# Patient Record
Sex: Female | Born: 1945
Health system: Southern US, Community
[De-identification: ages and names within clinical notes are randomized; demographics above are authoritative.]

## PROBLEM LIST (undated history)

## (undated) DIAGNOSIS — E119 Type 2 diabetes mellitus without complications: Secondary | ICD-10-CM

## (undated) DIAGNOSIS — I2510 Atherosclerotic heart disease of native coronary artery without angina pectoris: Secondary | ICD-10-CM

## (undated) DIAGNOSIS — E1165 Type 2 diabetes mellitus with hyperglycemia: Secondary | ICD-10-CM

## (undated) DIAGNOSIS — E785 Hyperlipidemia, unspecified: Secondary | ICD-10-CM

## (undated) DIAGNOSIS — Z720 Tobacco use: Secondary | ICD-10-CM

## (undated) DIAGNOSIS — I219 Acute myocardial infarction, unspecified: Secondary | ICD-10-CM

## (undated) HISTORY — PX: TUBAL LIGATION: SHX77

## (undated) HISTORY — PX: LAPAROSCOPY WITH TUBAL LIGATION: SHX5576

## (undated) HISTORY — PX: APPENDECTOMY: SHX54

## (undated) HISTORY — PX: CORONARY STENT PLACEMENT: SHX1402

---

## 2012-11-06 ENCOUNTER — Encounter: Payer: Self-pay | Admitting: Cardiology

## 2012-11-06 ENCOUNTER — Encounter (HOSPITAL_COMMUNITY): Payer: Self-pay | Admitting: Physician Assistant

## 2012-11-06 ENCOUNTER — Encounter (HOSPITAL_COMMUNITY)
Admission: EM | Disposition: A | Discharge status: Home / Self Care | Payer: Self-pay | Attending: Cardiovascular Disease

## 2012-11-06 ENCOUNTER — Ambulatory Visit (HOSPITAL_COMMUNITY): Admit: 2012-11-06 | Payer: Self-pay | Admitting: Cardiovascular Disease

## 2012-11-06 ENCOUNTER — Inpatient Hospital Stay (HOSPITAL_COMMUNITY)
Admission: EM | Admit: 2012-11-06 | Discharge: 2012-11-08 | DRG: 247 | Disposition: A | Discharge status: Home / Self Care | Payer: Medicare Other | Attending: Cardiovascular Disease | Admitting: Cardiovascular Disease

## 2012-11-06 DIAGNOSIS — I2109 ST elevation (STEMI) myocardial infarction involving other coronary artery of anterior wall: Principal | ICD-10-CM | POA: Diagnosis present

## 2012-11-06 DIAGNOSIS — Z955 Presence of coronary angioplasty implant and graft: Secondary | ICD-10-CM

## 2012-11-06 DIAGNOSIS — Z720 Tobacco use: Secondary | ICD-10-CM | POA: Diagnosis present

## 2012-11-06 DIAGNOSIS — I2510 Atherosclerotic heart disease of native coronary artery without angina pectoris: Secondary | ICD-10-CM | POA: Diagnosis present

## 2012-11-06 DIAGNOSIS — E119 Type 2 diabetes mellitus without complications: Secondary | ICD-10-CM | POA: Diagnosis present

## 2012-11-06 DIAGNOSIS — F17200 Nicotine dependence, unspecified, uncomplicated: Secondary | ICD-10-CM | POA: Diagnosis present

## 2012-11-06 DIAGNOSIS — I10 Essential (primary) hypertension: Secondary | ICD-10-CM | POA: Diagnosis present

## 2012-11-06 DIAGNOSIS — IMO0002 Reserved for concepts with insufficient information to code with codable children: Secondary | ICD-10-CM

## 2012-11-06 DIAGNOSIS — E1165 Type 2 diabetes mellitus with hyperglycemia: Secondary | ICD-10-CM

## 2012-11-06 DIAGNOSIS — E785 Hyperlipidemia, unspecified: Secondary | ICD-10-CM | POA: Diagnosis present

## 2012-11-06 HISTORY — DX: Acute myocardial infarction, unspecified: I21.9

## 2012-11-06 HISTORY — DX: Type 2 diabetes mellitus with hyperglycemia: E11.65

## 2012-11-06 HISTORY — DX: Atherosclerotic heart disease of native coronary artery without angina pectoris: I25.10

## 2012-11-06 HISTORY — DX: Tobacco use: Z72.0

## 2012-11-06 HISTORY — DX: Hyperlipidemia, unspecified: E78.5

## 2012-11-06 HISTORY — PX: LEFT HEART CATHETERIZATION WITH CORONARY ANGIOGRAM: SHX5451

## 2012-11-06 LAB — COMPREHENSIVE METABOLIC PANEL WITH GFR
ALT: 20 U/L (ref 0–35)
AST: 60 U/L — ABNORMAL HIGH (ref 0–37)
Albumin: 3.6 g/dL (ref 3.5–5.2)
Alkaline Phosphatase: 107 U/L (ref 39–117)
BUN: 8 mg/dL (ref 6–23)
CO2: 24 meq/L (ref 19–32)
Calcium: 8.2 mg/dL — ABNORMAL LOW (ref 8.4–10.5)
Chloride: 96 meq/L (ref 96–112)
Creatinine, Ser: 0.59 mg/dL (ref 0.50–1.10)
GFR calc Af Amer: >90 mL/min
GFR calc non Af Amer: >90 mL/min
Glucose, Bld: 119 mg/dL — ABNORMAL HIGH (ref 70–99)
Potassium: 3.9 meq/L (ref 3.5–5.1)
Sodium: 133 meq/L — ABNORMAL LOW (ref 135–145)
Total Bilirubin: 0.4 mg/dL (ref 0.3–1.2)
Total Protein: 6.9 g/dL (ref 6.0–8.3)

## 2012-11-06 LAB — TROPONIN I: Troponin I: 8.18 ng/mL (ref <0.30)

## 2012-11-06 LAB — MRSA PCR SCREENING: MRSA by PCR: NEGATIVE

## 2012-11-06 LAB — PRO B NATRIURETIC PEPTIDE: Pro B Natriuretic peptide (BNP): 2216 pg/mL — ABNORMAL HIGH (ref 0–125)

## 2012-11-06 SURGERY — LEFT HEART CATHETERIZATION WITH CORONARY ANGIOGRAM
Anesthesia: LOCAL

## 2012-11-06 MED ORDER — SODIUM CHLORIDE 0.9 % IJ SOLN
3.0000 mL | Freq: Two times a day (BID) | INTRAMUSCULAR | Status: DC
  Administered 2012-11-07 – 2012-11-08 (×3): 3 mL via INTRAVENOUS

## 2012-11-06 MED ORDER — NITROGLYCERIN IN D5W 200-5 MCG/ML-% IV SOLN
3.0000 ug/min | INTRAVENOUS | Status: DC
  Administered 2012-11-06: 10 ug/min via INTRAVENOUS

## 2012-11-06 MED ORDER — INFLUENZA VAC SPLIT QUAD 0.5 ML IM SUSP
0.5000 mL | INTRAMUSCULAR | Status: AC
  Administered 2012-11-07: 0.5 mL via INTRAMUSCULAR
  Filled 2012-11-06: qty 0.5

## 2012-11-06 MED ORDER — METOPROLOL TARTRATE 25 MG PO TABS
25.0000 mg | ORAL_TABLET | Freq: Two times a day (BID) | ORAL | Status: DC
  Administered 2012-11-06 – 2012-11-08 (×4): 25 mg via ORAL
  Filled 2012-11-06 (×5): qty 1

## 2012-11-06 MED ORDER — MORPHINE SULFATE 2 MG/ML IJ SOLN
2.0000 mg | INTRAMUSCULAR | Status: DC | PRN
  Administered 2012-11-06: 2 mg via INTRAVENOUS
  Filled 2012-11-06: qty 1

## 2012-11-06 MED ORDER — ATORVASTATIN CALCIUM 80 MG PO TABS
80.0000 mg | ORAL_TABLET | Freq: Every day | ORAL | Status: DC
  Administered 2012-11-06 – 2012-11-07 (×2): 80 mg via ORAL
  Filled 2012-11-06 (×3): qty 1

## 2012-11-06 MED ORDER — PNEUMOCOCCAL VAC POLYVALENT 25 MCG/0.5ML IJ INJ
0.5000 mL | INJECTION | INTRAMUSCULAR | Status: AC
  Administered 2012-11-07: 0.5 mL via INTRAMUSCULAR
  Filled 2012-11-06: qty 0.5

## 2012-11-06 MED ORDER — TICAGRELOR 90 MG PO TABS
90.0000 mg | ORAL_TABLET | Freq: Two times a day (BID) | ORAL | Status: DC
  Administered 2012-11-06 – 2012-11-08 (×4): 90 mg via ORAL
  Filled 2012-11-06 (×5): qty 1

## 2012-11-06 MED ORDER — NITROGLYCERIN 0.4 MG SL SUBL: 0.4000 mg | SUBLINGUAL_TABLET | SUBLINGUAL | Status: DC | PRN

## 2012-11-06 MED ORDER — PANTOPRAZOLE SODIUM 20 MG PO TBEC
20.0000 mg | DELAYED_RELEASE_TABLET | Freq: Every day | ORAL | Status: DC
  Administered 2012-11-06 – 2012-11-08 (×3): 20 mg via ORAL
  Filled 2012-11-06 (×3): qty 1

## 2012-11-06 MED ORDER — LORAZEPAM 0.5 MG PO TABS
0.5000 mg | ORAL_TABLET | ORAL | Status: DC | PRN
  Administered 2012-11-06: 0.5 mg via ORAL
  Filled 2012-11-06: qty 1

## 2012-11-06 MED ORDER — SODIUM CHLORIDE 0.9 % IJ SOLN
3.0000 mL | INTRAMUSCULAR | Status: DC | PRN
  Administered 2012-11-06: 3 mL via INTRAVENOUS

## 2012-11-06 MED ORDER — SODIUM CHLORIDE 0.9 % IV SOLN: 250.0000 mL | INTRAVENOUS | Status: DC | PRN

## 2012-11-06 MED ORDER — ONDANSETRON HCL 4 MG/2ML IJ SOLN: 4.0000 mg | Freq: Four times a day (QID) | INTRAMUSCULAR | Status: DC | PRN

## 2012-11-06 MED ORDER — GI COCKTAIL ~~LOC~~
30.0000 mL | Freq: Three times a day (TID) | ORAL | Status: DC | PRN
  Administered 2012-11-06: 30 mL via ORAL
  Filled 2012-11-06: qty 30

## 2012-11-06 MED ORDER — ACETAMINOPHEN 325 MG PO TABS: 650.0000 mg | ORAL_TABLET | ORAL | Status: DC | PRN

## 2012-11-06 MED ORDER — SODIUM CHLORIDE 0.9 % IV SOLN
1.0000 mL/kg/h | INTRAVENOUS | Status: AC
  Administered 2012-11-06: 1 mL/kg/h via INTRAVENOUS

## 2012-11-06 MED ORDER — ENALAPRIL MALEATE 2.5 MG PO TABS
2.5000 mg | ORAL_TABLET | Freq: Two times a day (BID) | ORAL | Status: DC
  Administered 2012-11-06: 2.5 mg via ORAL
  Filled 2012-11-06 (×3): qty 1

## 2012-11-06 MED ORDER — OXYCODONE-ACETAMINOPHEN 5-325 MG PO TABS
1.0000 | ORAL_TABLET | ORAL | Status: DC | PRN
  Administered 2012-11-07 – 2012-11-08 (×2): 1 via ORAL
  Administered 2012-11-08 (×2): 2 via ORAL
  Filled 2012-11-06 (×2): qty 2
  Filled 2012-11-06 (×2): qty 1

## 2012-11-06 NOTE — Progress Notes (Signed)
Rhonda Barrett PA notified of + troponin.

## 2012-11-06 NOTE — H&P (Signed)
History and Physical   Patient ID: Marissa Schneider MRN: [id number], DOB/AGE: 02/20/1945 67 y.o. Date of Encounter: 11/06/2012  Primary Physician: No PCP Per Patient Primary Cardiologist: New, will followup in [place]  Chief Complaint:  STEMI  HPI: Marissa Schneider is a 67 y.o. female with no history of CAD. She had chest pain intermittently over the last week. She went to a club last night and went out for breakfast afterwards at about 3 AM. At 7 AM, she had onset of substernal chest pain she describes as a "bubble" and a pressure. It reached a 10/10. The pain went through to her back and was associated with some shortness of breath, abdominal pain, nausea and vomiting (hematemesis) with diaphoresis. She went to [facility] where her ECG was consistent with an anterior ST elevation MI. She was treated there and transferred emergently to [facility]. Upon arrival at time, she was still having pain and her ECG was still abnormal. She was taken directly to the Cath Lab   Past medical history: The patient has not had a recent checkup. She is not aware of any current diagnosis of hypertension, diabetes or hyperlipidemia since she lost weight (more than 50 pounds).  Surgical History:  Past Surgical History  Procedure Laterality Date  . Laparoscopy with tubal ligation       I have reviewed the patient's current medications. Prior to Admission medications   Ibuprofen and Benadryl when necessary     Allergies:  Allergies  Allergen Reactions  . Aspirin Rash    History   Social History  . Marital Status: Single    Spouse Name: N/A    Number of Children: N/A  . Years of Education: N/A   Occupational History  . Security guard    Social History Main Topics  . Smoking status: Current Every Day Smoker -- 1.00 packs/day for 50 years  . Smokeless tobacco: Never Used  . Alcohol Use: Yes     Comment: Drinks at social events  . Drug Use: No  . Sexual Activity: Not on  file   Other Topics Concern  . Not on file   Social History Narrative   Lives alone    No family history on file. Family Status  Relation Status Death Age  . Mother Deceased     No history of CAD  . Father Deceased     No history of CAD  . Brother Deceased 58    MI  . Maternal Grandmother Deceased     History of CAD/MI    Review of Systems:   Full 14-point review of systems otherwise negative except as noted above.  Physical Exam: Height 5\' 8"  (1.727 m), weight 160 lb (72.576 kg). General: Well developed, well nourished,female in moderate acute distress. Head: Normocephalic, atraumatic, sclera non-icteric, no xanthomas, nares are without discharge. Dentition: Good Neck: No carotid bruits. JVD not elevated. No thyromegally Lungs: Good expansion bilaterally. without wheezes or rhonchi.  Heart: Regular rate and rhythm with S1 S2.  No S3 or S4.  No murmur, no rubs, or gallops appreciated. Abdomen: Soft, non-tender, non-distended with normoactive bowel sounds. No hepatomegaly. No rebound/guarding. No obvious abdominal masses. Msk:  Strength and tone appear normal for age. No joint deformities or effusions, no spine or costo-vertebral angle tenderness. Extremities: No clubbing or cyanosis. No edema.  Distal pedal pulses are 2+ in 4 extrem Neuro: Alert and oriented X 3. Moves all extremities spontaneously. No focal deficits noted. Psych:  Responds to questions appropriately with a normal affect. Skin: No rashes or lesions noted  Labs: Done at [facility]   WBC 5.6   HGB 14.8   HCT 44.5   MCV 85.9   PLT 234       NA 136   K 3.5   CL 99   CO2 29   BUN 10   CR 0.79   GLU 175       LFTs WNL       CK 302   CKMB 13.8   INDEX 4.5   TROP 0.95       INR 1.0   PT 12.9   PTT 28.7         Radiology/Studies: At [facility]    ECG: Sinus rhythm, anterior ST elevation  ASSESSMENT AND PLAN:  Active Problems:   * No active hospital problems. *   Signed, Theodore Demark,  PA-C 11/06/2012 1:48 PM Beeper [phone]  Patient seen, examined. Available data reviewed. Agree with findings, assessment, and plan as outlined by Theodore Demark, PA-C. The patient is a 67 year-old woman with no history of HTN/DM/dysplipidemia presenting with an acute anterior STEMI. She has had intermittent CP x 24-36 hours but constant pain x 6 hours radiating to her back. EKG is diagnostic of STEMI. She presents as a transfer from [facility] directly to the cath lab. Exam as outlined above with which I agree. Pulses are 2+ peripherally, there is no edema, heart is RRR, and lungs are clear. No abdominal tenderness.   ASSESSMENT: 1. Anterior STEMI - emergency cath and PCI. ASA allergy received plavix 300 mg in [place] will load with Brilinta 180 mg since she will be treated with monotherapy. Further plans pending cath results.  2. Tobacco - cessation advised and will receive counseling in the hospital.   Further plans pending cath result. Emergency implied consent obtained. Pt is alert and oriented and understands plan.  Tonny Bollman, M.D. 11/06/2012 2:27 PM

## 2012-11-06 NOTE — CV Procedure (Signed)
    Cardiac Catheterization Procedure Note  Name: Marissa Schneider MRN: [id number] DOB: [birth year]  Procedure: Left Heart Cath, Selective Coronary Angiography, LV angiography, PTCA and stenting of the mid-LAD  Indication: Anterior STEMI  Procedural Details:  The right wrist was prepped, draped, and anesthetized with 1% lidocaine. Using the modified Seldinger technique, a 5/6 French slender sheath was introduced into the right radial artery. 3 mg of verapamil was administered through the sheath, weight-based unfractionated heparin was administered intravenously. Standard Judkins catheters were used for selective coronary angiography and left ventriculography. Catheter exchanges were performed over an exchange length guidewire.  PROCEDURAL FINDINGS Hemodynamics: AO 175/88 LV 177/16   Coronary angiography: Coronary dominance: right  Left mainstem: Patent vessel with 20-30% distal LM stenosis  Left anterior descending (LAD): Proximal irregularity. There is severe disease in the mid-LAD. Diffuse 50% stenosis leading into a 99% mid-vessel stenosis. The first diagonal is large with 50% ostial stenosis and it originates at the proximal aspect of LAD plaque. The distal LAD is tortuous and widely patent as it wraps the LV apex.   Left circumflex (LCx): 50% ostial stenosis, 50% stenosis of OM1 with sequential lesions  Right coronary artery (RCA): normal caliber, dominant vessel. There is diffuse disease noted. There is 50% mid-vessel stenosis and 50-70% bifurcational disease at the PDA/PLA origin.  Left ventriculography: Left ventricular systolic function is moderately depressed. There is hypokinesis of the distal anterior wall and anteroapex. The LVEF is estimated at 45%.   PCI Note:  Following the diagnostic procedure, the decision was made to proceed with PCI. She was loaded with brilinta 180 mg. Weight-based bivalirudin was given for anticoagulation. Once a therapeutic ACT was achieved, a  6 [place] XB-LAD guide catheter was inserted.  A cougar coronary guidewire was initially used but would not cross the lesion so a BMW wire was used to cross the lesion.  The lesion was predilated with a 2.0x15 mm balloon.  The lesion was then stented with a 2.5x24 mm Promus Premier drug-eluting stent.  The stent was postdilated with a 2.75 mm noncompliant balloon to 16 atm.  Following PCI, there was 0% residual stenosis and TIMI-3 flow. Final angiography confirmed an excellent result. The patient tolerated the procedure well. There were no immediate procedural complications. A TR band was used for radial hemostasis. The patient was transferred to the post catheterization recovery area for further monitoring.  PCI Data: Vessel - LAD/Segment - mid Percent Stenosis (pre)  99 TIMI-flow 2 Stent 2.5x24 mm Promus Premier DES Percent Stenosis (post) 0 TIMI-flow (post) 3  Final Conclusions:   1. Severe mid-LAD stenosis treated successfully with primary PCI using a DES 2. Moderate RCA and LCx stenosis 3. Moderate LV dysfunction   Recommendations:  Tobacco cessation, post-MI medical therapy. Brilinta alone - long-term since ASA allergy. If no complication anticipate discharge Tuesday 10/14.  Tonny Bollman 11/06/2012, 2:38 PM

## 2012-11-06 NOTE — Progress Notes (Signed)
2000:  Dr. Jon Billings notified of increased BP.  Orders rec'd. 2015: "I've got this chest pressure".  Patient up to bedside commode first time since cardiac intervention.  C/O substernal pressure 8/10 with radiation down right arm. Back to bed with assistance. Placed on O2@2L /M.  Stat EKG done.  Titrating NTG drip to chest pain without relief of symptoms.  Patient denies nausea, SOB.  Morphine 2 mg IV PRN Chest Pain.  Dr. Jon Billings paged, notified of all parameters. 2025:  Dr. Jon Billings at bedside.  Patient now complaining of pressure in chest and epigastric area, tenderness in R & L Upper quadrants of abdomen.  Dr. Jon Billings notified of all parameters.  Orders received. 2045:  Chest pain decreasing in intensity.  Continue to monitor. 2130:  Patient sleeping intermittently.  States pain is 1/10.  Continue to monitor.

## 2012-11-07 ENCOUNTER — Encounter (HOSPITAL_COMMUNITY): Payer: Self-pay | Admitting: Nurse Practitioner

## 2012-11-07 DIAGNOSIS — Z720 Tobacco use: Secondary | ICD-10-CM | POA: Diagnosis present

## 2012-11-07 DIAGNOSIS — E785 Hyperlipidemia, unspecified: Secondary | ICD-10-CM

## 2012-11-07 DIAGNOSIS — F17200 Nicotine dependence, unspecified, uncomplicated: Secondary | ICD-10-CM

## 2012-11-07 DIAGNOSIS — I2510 Atherosclerotic heart disease of native coronary artery without angina pectoris: Secondary | ICD-10-CM

## 2012-11-07 DIAGNOSIS — I369 Nonrheumatic tricuspid valve disorder, unspecified: Secondary | ICD-10-CM

## 2012-11-07 DIAGNOSIS — I2109 ST elevation (STEMI) myocardial infarction involving other coronary artery of anterior wall: Secondary | ICD-10-CM

## 2012-11-07 LAB — BASIC METABOLIC PANEL
BUN: 9 mg/dL (ref 6–23)
CO2: 25 mEq/L (ref 19–32)
Calcium: 8.2 mg/dL — ABNORMAL LOW (ref 8.4–10.5)
Creatinine, Ser: 0.67 mg/dL (ref 0.50–1.10)
GFR calc Af Amer: >90 mL/min (ref >90)

## 2012-11-07 LAB — POCT I-STAT 3, ART BLOOD GAS (G3+)
O2 Saturation: 98 %
TCO2: 29 mmol/L (ref 0–100)
pCO2 arterial: 51.5 mmHg — ABNORMAL HIGH (ref 35.0–45.0)
pH, Arterial: 7.329 — ABNORMAL LOW (ref 7.350–7.450)
pO2, Arterial: 109 mmHg — ABNORMAL HIGH (ref 80.0–100.0)

## 2012-11-07 LAB — CBC
HCT: 37.7 % (ref 36.0–46.0)
MCH: 28.5 pg (ref 26.0–34.0)
MCHC: 33.7 g/dL (ref 30.0–36.0)
MCV: 84.7 fL (ref 78.0–100.0)
Platelets: 209 10*3/uL (ref 150–400)
RDW: 14.3 % (ref 11.5–15.5)

## 2012-11-07 LAB — POCT I-STAT, CHEM 8
BUN: 7 mg/dL (ref 6–23)
Chloride: 96 mEq/L (ref 96–112)
Creatinine, Ser: 0.7 mg/dL (ref 0.50–1.10)
Sodium: 136 mEq/L (ref 135–145)
TCO2: 25 mmol/L (ref 0–100)

## 2012-11-07 LAB — TROPONIN I
Troponin I: >20 ng/mL (ref <0.30)
Troponin I: >20 ng/mL (ref <0.30)

## 2012-11-07 LAB — LIPID PANEL
Cholesterol: 206 mg/dL — ABNORMAL HIGH (ref 0–200)
VLDL: 41 mg/dL — ABNORMAL HIGH (ref 0–40)

## 2012-11-07 LAB — HEMOGLOBIN A1C: Hgb A1c MFr Bld: 7.3 % — ABNORMAL HIGH (ref <5.7)

## 2012-11-07 MED ORDER — NICOTINE 7 MG/24HR TD PT24
7.0000 mg | MEDICATED_PATCH | Freq: Every day | TRANSDERMAL | Status: DC
  Administered 2012-11-07 – 2012-11-08 (×2): 7 mg via TRANSDERMAL
  Filled 2012-11-07 (×3): qty 1

## 2012-11-07 MED ORDER — INSULIN ASPART 100 UNIT/ML ~~LOC~~ SOLN
0.0000 [IU] | Freq: Three times a day (TID) | SUBCUTANEOUS | Status: DC
  Administered 2012-11-08: 2 [IU] via SUBCUTANEOUS

## 2012-11-07 MED ORDER — ENALAPRIL MALEATE 5 MG PO TABS
5.0000 mg | ORAL_TABLET | Freq: Two times a day (BID) | ORAL | Status: DC
  Administered 2012-11-07 – 2012-11-08 (×3): 5 mg via ORAL
  Filled 2012-11-07 (×4): qty 1

## 2012-11-07 MED FILL — Nitroglycerin IV Soln 200 MCG/ML in D5W: INTRAVENOUS | Qty: 250 | Status: AC

## 2012-11-07 MED FILL — Heparin Sodium (Porcine) 100 Unt/ML in Sodium Chloride 0.45%: INTRAMUSCULAR | Qty: 250 | Status: AC

## 2012-11-07 NOTE — Care Management Note (Addendum)
    Page 1 of 1   11/08/2012     11:01:59 AM   CARE MANAGEMENT NOTE 11/08/2012  Patient:  Marissa Schneider, Marissa Schneider   Account Number:  [id number]  Date Initiated:  11/07/2012  Documentation initiated by:  Junius Creamer  Subjective/Objective Assessment:   adm w mi     Action/Plan:   lives w fam,   Anticipated DC Date:  11/08/2012   Anticipated DC Plan:  HOME/SELF CARE      DC Planning Services  CM consult  Medication Assistance      Choice offered to / List presented to:             Status of service:   Medicare Important Message given?   (If response is "NO", the following Medicare IM given date fields will be blank) Date Medicare IM given:   Date Additional Medicare IM given:    Discharge Disposition:  HOME/SELF CARE  Per UR Regulation:  Reviewed for med. necessity/level of care/duration of stay  If discussed at Long Length of Stay Meetings, dates discussed:    Comments:  10/14  1056a debbie Tashon Capp rn,bsn spoke w pt. she does not have medicare d but is in process of signing up. she has 30day free brilinta card and md has signed his part of pt assist form and pt will fill out rest and send in w income information.  10/13 1027a debbie Wynema Garoutte rn,bsn card rehab gave pt 30day free and copay assist card for brilinta.

## 2012-11-07 NOTE — Progress Notes (Signed)
Echocardiogram 2D Echocardiogram has been performed.  Marissa Schneider 11/07/2012, 9:47 AM

## 2012-11-07 NOTE — Progress Notes (Signed)
Inpatient Diabetes Program Recommendations  AACE/ADA: New Consensus Statement on Inpatient Glycemic Control (2013)  Target Ranges:  Prepandial:   less than 140 mg/dL      Peak postprandial:   less than 180 mg/dL (1-2 hours)      Critically ill patients:  140 - 180 mg/dL   Reason for Assessment: Elevated Hgb A1C with no history of diabetes noted by MD  Inpatient Diabetes Program Recommendations HgbA1C: Hgb A1C is 7.3 indicative of diabetes  Note:  Request MD address elevated Hgb A1C and likely diagnosis of diabetes.  Request Glycemic Control Order Set be completed.  The American Diabetes Association recommends that patients with diabetes have CBG's checked at least four times a day while in the hospital setting.  Please order CBG's ac & hs while eating or if patient becomes NPO, every 4 hours. Request CHO Modified diet order.  Request MD discuss Hgb A1C with patient and its implications.  After MD has discussed Hgb A1C, will place dietitian consult, etc.  Discussed with Aldean Jewett, RN.  Thank you.  Marissa Schneider S. Marissa Lincoln, RN, CNS, CDE Inpatient Diabetes Program, team pager [phone]

## 2012-11-07 NOTE — Progress Notes (Signed)
Chaplain received page for code stemi and met pt's family in short stay waiting area by the cath lab. Chaplain acted as Print production planner between family and Programme researcher, broadcasting/film/video, and was present with family when PA and physician came to speak with the family.   Chaplain escorted family to [facility] waiting area and informed nursing that they were waiting to be called back to be with pt.   Chaplain provided emotional/spiritual support, presence, empathic listening, and acted as Print production planner with family and staff.  Family was very appreciative of chaplain's presence and care.  Will refer for follow up.  Maurene Capes, Chaplain

## 2012-11-07 NOTE — Plan of Care (Signed)
Problem: Consults Goal: Tobacco Cessation referral if indicated Outcome: Progressing Ordered Nicotine patch.

## 2012-11-07 NOTE — Progress Notes (Signed)
Patient Name: Larinda Herter Date of Encounter: 11/07/2012   Principal Problem:   ST elevation myocardial infarction (STEMI) of anterior wall Active Problems:   CAD (coronary artery disease)   Tobacco abuse   Hyperlipidemia    SUBJECTIVE  The patient denis any chest pain or shortness of breath.  CURRENT MEDS . atorvastatin  80 mg Oral q1800  . enalapril  2.5 mg Oral BID  . influenza vac split quadrivalent PF  0.5 mL Intramuscular Tomorrow-1000  . metoprolol tartrate  25 mg Oral BID  . pantoprazole  20 mg Oral Daily  . pneumococcal 23 valent vaccine  0.5 mL Intramuscular Tomorrow-1000  . sodium chloride  3 mL Intravenous Q12H  . Ticagrelor  90 mg Oral BID    OBJECTIVE  Filed Vitals:   11/07/12 0417 11/07/12 0500 11/07/12 0600 11/07/12 0700  BP:  126/60 137/56 119/57  Pulse:  74 76 71  Temp:      TempSrc:      Resp:  14 17 20   Height:      Weight: 165 lb 9.1 oz (75.1 kg)     SpO2:  98% 100% 98%    Intake/Output Summary (Last 24 hours) at 11/07/12 0806 Last data filed at 11/07/12 0715  Gross per 24 hour  Intake 1991.48 ml  Output   1650 ml  Net 341.48 ml   Filed Weights   11/06/12 1333 11/06/12 1500 11/07/12 0417  Weight: 160 lb (72.576 kg) 160 lb 0.9 oz (72.6 kg) 165 lb 9.1 oz (75.1 kg)    PHYSICAL EXAM  General: Pleasant, NAD. Neuro: Alert and oriented X 3. Moves all extremities spontaneously. Psych: Normal affect. HEENT:  Normal  Neck: Supple without bruits or JVD. Lungs:  Resp regular and unlabored, CTA. Heart: RRR no s3, s4, or murmurs. Abdomen: Soft, non-tender, non-distended, BS + x 4.  Extremities: No clubbing, cyanosis or edema. DP/PT/Radials 2+ and equal bilaterally.  Accessory Clinical Findings  CBC  Recent Labs  11/07/12 0225  WBC 6.7  HGB 12.7  HCT 37.7  MCV 84.7  PLT 209   Basic Metabolic Panel  Recent Labs  11/06/12 1542 11/07/12 0225  NA 133* 135  K 3.9 3.6  CL 96 100  CO2 24 25  GLUCOSE 119* 129*  BUN 8 9    CREATININE 0.59 0.67  CALCIUM 8.2* 8.2*   Liver Function Tests  Recent Labs  11/06/12 1542  AST 60*  ALT 20  ALKPHOS 107  BILITOT 0.4  PROT 6.9  ALBUMIN 3.6   No results found for this basename: LIPASE, AMYLASE,  in the last 72 hours Cardiac Enzymes  Recent Labs  11/06/12 1542 11/06/12 2038 11/07/12 0225  TROPONINI 8.18* >20.00* >20.00*   Hemoglobin A1C  Recent Labs  11/06/12 1542  HGBA1C 7.3*   Fasting Lipid Panel  Recent Labs  11/07/12 0225  CHOL 206*  HDL 33*  LDLCALC 132*  TRIG 204*  CHOLHDL 6.2   Thyroid Function Tests  Recent Labs  11/06/12 1542  TSH 0.664    TELE  SR, HR 70'  ECG  SR, persistent anterior ST elavations with reciprocal T wave inversions in the inferolateral leads   ASSESSMENT AND PLAN  67 year old female with h/o heavy smoking who was admitted with anterior STEMI yesterday  1. STEMI  - Severe mid-LAD stenosis treated successfully with primary PCI using a DES, residual Moderate RCA and LCx stenosis, Moderate LV dysfunction. Chest pain resolved post PCI. ECG unchanged, we will  follow  - on Ticagrelor 90 mg BID (allergy to aspirin) - high dose Lipitor - Metoprolol 25 mg BID - uptitrate Lisinopril as tolerated by BP  2. Hypertension - controlled on current regimen, uptitrate Enalapril for LV dysfunction  3. Smoking cessation counseling provided  Signed, Lars Masson MD

## 2012-11-07 NOTE — Progress Notes (Signed)
CARDIAC REHAB PHASE I   PRE:  Rate/Rhythm: 77 SR  BP:  Supine:   Sitting: 103/54  Standing:    SaO2:   MODE:  Ambulation: 170 ft   POST:  Rate/Rhythm: 84 SR  BP:  Supine:   Sitting: 124/51  Standing:     SaO2:  0950-1105 Assisted X 1 to ambulate. Gait a little wobbly. Pt c/o of legs feeling weak. VS stable Pt back to recliner after walk with call light in reach. Stated MI and stent education with pt. Discussed smoking cessation with pt. She states that she does not smoke much and feels that stopping will not be a problem for her. Gave pt tips for quitting and coaching contact number. Discussed with pt results of A1C, states that she has had problems with her blood sugar in the past but lost weight and took medication for awhile her blood sugar came back to normal. Pt admits to no regular follow up with physician.She also admits to eating an lot of sweets and drinking beer, heavy at times.  We will follow pt in the am to complete education and continue ambulation.  Melina Copa RN 11/07/2012 11:06 AM

## 2012-11-08 ENCOUNTER — Telehealth: Payer: Self-pay | Admitting: Cardiology

## 2012-11-08 ENCOUNTER — Encounter (HOSPITAL_COMMUNITY): Payer: Self-pay | Admitting: Nurse Practitioner

## 2012-11-08 DIAGNOSIS — IMO0002 Reserved for concepts with insufficient information to code with codable children: Secondary | ICD-10-CM

## 2012-11-08 DIAGNOSIS — E1165 Type 2 diabetes mellitus with hyperglycemia: Secondary | ICD-10-CM

## 2012-11-08 HISTORY — DX: Type 2 diabetes mellitus with hyperglycemia: E11.65

## 2012-11-08 HISTORY — DX: Reserved for concepts with insufficient information to code with codable children: IMO0002

## 2012-11-08 LAB — GLUCOSE, CAPILLARY
Glucose-Capillary: 113 mg/dL — ABNORMAL HIGH (ref 70–99)
Glucose-Capillary: 137 mg/dL — ABNORMAL HIGH (ref 70–99)

## 2012-11-08 MED ORDER — TICAGRELOR 90 MG PO TABS: 90.0000 mg | ORAL_TABLET | Freq: Two times a day (BID) | ORAL | Status: DC

## 2012-11-08 MED ORDER — NITROGLYCERIN 0.4 MG SL SUBL: 0.4000 mg | SUBLINGUAL_TABLET | SUBLINGUAL | Status: DC | PRN

## 2012-11-08 MED ORDER — PANTOPRAZOLE SODIUM 40 MG PO TBEC: 40.0000 mg | DELAYED_RELEASE_TABLET | Freq: Every day | ORAL | Status: DC

## 2012-11-08 MED ORDER — METOPROLOL TARTRATE 25 MG PO TABS: 25.0000 mg | ORAL_TABLET | Freq: Two times a day (BID) | ORAL | Status: DC

## 2012-11-08 MED ORDER — ENALAPRIL MALEATE 5 MG PO TABS: 5.0000 mg | ORAL_TABLET | Freq: Two times a day (BID) | ORAL | Status: DC

## 2012-11-08 MED ORDER — METFORMIN HCL 500 MG PO TABS: 500.0000 mg | ORAL_TABLET | Freq: Every day | ORAL | Status: DC

## 2012-11-08 MED ORDER — ATORVASTATIN CALCIUM 80 MG PO TABS: 80.0000 mg | ORAL_TABLET | Freq: Every day | ORAL | Status: DC

## 2012-11-08 MED ORDER — LIVING WELL WITH DIABETES BOOK
Freq: Once | Status: AC
  Administered 2012-11-08: 13:00:00
  Filled 2012-11-08: qty 1

## 2012-11-08 MED ORDER — TRAMADOL HCL 50 MG PO TABS: 50.0000 mg | ORAL_TABLET | Freq: Three times a day (TID) | ORAL | Status: DC | PRN

## 2012-11-08 NOTE — Progress Notes (Signed)
CARDIAC REHAB PHASE I   PRE:  Rate/Rhythm: 78 SR  BP:  Supine:   Sitting: 114/47  Standing:    SaO2:   MODE:  Ambulation: 350 ft   POST:  Rate/Rhythm: 83SR  BP:  Supine:   Sitting: 131/39  Standing:    SaO2:  0900-0952 Pt walked 350 ft with hand held asst stopping twice to rest. Pt stated that was all she could do and needed to sit. Pt stated it was harder for her to walk than to dance. Encouraged pt to start walking slowly and increase distance daily. Exercise ed given. Discussed with pt better food choices as she is used to eating high fat meals like pig feet, fat back and ham hock. Discussed CRP 2. Pt did not want to be referred to [facility] program so referral will be sent to [facility]. Pt c/o abdominal pain after walk but not in chest. Re enforced no smoking.    Luetta Nutting, RN BSN  11/08/2012 9:47 AM   &*

## 2012-11-08 NOTE — Discharge Summary (Signed)
Discharge Summary   Patient ID: Marissa Schneider,  MRN: [id number], DOB/AGE: 1945/01/29 67 y.o.  Admit date: 11/06/2012 Discharge date: 11/08/2012  Primary Care Provider: No PCP Per Patient Primary Cardiologist: Dominga Ferry, MD   Discharge Diagnoses Principal Problem:   ST elevation myocardial infarction (STEMI) of anterior wall  **S/p PCI and drug-eluting stent placement to the mid-LAD this admission.  Active Problems:   CAD (coronary artery disease)   Tobacco abuse   Hyperlipidemia  **High potency statin initiated this admission.   Newly Diagnosed Type II Diabetes Mellitus  *HbA1c 7.3.  Allergies Allergies  Allergen Reactions  . Cheese Hives    On chin  . Aspirin Rash   Procedures  Cardiac Catheterization and Percutaneous Coronary Intervention 10.12.2014  Hemodynamics: AO 175/88 LV 177/16              Coronary angiography: Coronary dominance: right  Left mainstem: Patent vessel with 20-30% distal LM stenosis  Left anterior descending (LAD): Proximal irregularity. There is severe disease in the mid-LAD. Diffuse 50% stenosis leading into a 99% mid-vessel stenosis. The first diagonal is large with 50% ostial stenosis and it originates at the proximal aspect of LAD plaque. The distal LAD is tortuous and widely patent as it wraps the LV apex.     **The mid-LAD was successfully stented using a  2.5x24 mm Promus Premier drug-eluting stent.**  Left circumflex (LCx): 50% ostial stenosis, 50% stenosis of OM1 with sequential lesions Right coronary artery (RCA): normal caliber, dominant vessel. There is diffuse disease noted. There is 50% mid-vessel stenosis and 50-70% bifurcational disease at the PDA/PLA origin. Left ventriculography: Left ventricular systolic function is moderately depressed. There is hypokinesis of the distal anterior wall and anteroapex. The LVEF is estimated at 45%.   Final Conclusions:   1. Severe mid-LAD stenosis treated successfully with primary  PCI using a DES 2. Moderate RCA and LCx stenosis 3. Moderate LV dysfunction   Recommendations:  Tobacco cessation, post-MI medical therapy. Brilinta alone - long-term since ASA allergy. _____________   2D Echocardiogram 10.13.2014  Study Conclusions  - Left ventricle: Hypokinesis apical anteroseptal segment   and apical inferoseptal segment. The EF remains 55%. The   cavity size was normal. Wall thickness was increased in a   pattern of mild LVH. Findings consistent with left   ventricular diastolic dysfunction. - Right ventricle: The cavity size was normal. Systolic   function was normal. - Pulmonary arteries: PA peak pressure: 35mm Hg (S). _____________   History of Present Illness  67 y/o female without prior cardiac history.  She had been experiencing intermittent chest discomfort for approximately one week prior to admission.  On the night prior to admission, after going out clubbing and then getting breakfast afterwards, she had sudden onset of substernal chest discomfort associated with dyspnea, abdominal and back pain, nausea, vomiting, and diaphoresis.  She presented to the [facility] ED where ECG showed anterior ST segment elevation.  A Code STEMI was called and patient was transported emergently to [facility] for further evaluation and catheterization.  Hospital Course  Upon arrival to [facility], patient was taken immediately to the cardiac catheterization laboratory.  There, she underwent emergent diagnostic catheterization revealing a 99% stenosis within the mid-LAD.  She otherwise had moderate, non-obstructive CAD and an EF of 45%.  The LAD was felt to be the culprit vessel and this was successfully treated using a 2.5 x 24 mm Promus Premier DES.  She was loaded with Brilinta therapy during the  procedure.  She tolerated this procedure well and post-procedure was monitored in the coronary intensive care unit.  There, she ruled in for MI, eventually peaking her  troponin at > 20.00.  She was placed on beta blocker, ace inhibitor, and high potency statin in addition to Brilinta.  She has an existing allergy to Aspirin and thus she is not on Aspirin therapy.    [name] did have some recurrent chest pain following PCI.  Follow-up ECG's demonstrated ongoing anterior ST elevation without new changes.  Pain subsided with oral narcotics.  Follow-up 2D echocardiogram showed normal overall LV with hypokinesis of the apical anteroseptal segment and apical inferoseptal segment.  She has been noted to be hyperglycemic and HbA1c returned elevated at 7.3.  We placed her on sliding scale insulin and she has met with the diabetes education coordinator and provided instruction.  We will plan to initiate metformin therapy at discharge and have recommended that she f/u with her PCP next week.  She plans to see Dr. Merleen Milliner in [place].  Patient has been ambulating without difficulty with cardiac rehab.  She has had no further chest pain this morning.  She has been counseled on the importance of smoking cessation.  She will be discharged home today in good condition.  We have arranged for follow-up in our [place] office next week.  Discharge Vitals Blood pressure 118/53, pulse 75, temperature 97.6 F (36.4 C), temperature source Oral, resp. rate 18, height 5\' 8"  (1.727 m), weight 164 lb 7.4 oz (74.6 kg), SpO2 96.00%.  Filed Weights   11/06/12 1500 11/07/12 0417 11/08/12 0500  Weight: 160 lb 0.9 oz (72.6 kg) 165 lb 9.1 oz (75.1 kg) 164 lb 7.4 oz (74.6 kg)   Labs  CBC  Recent Labs  11/06/12 1356 11/07/12 0225  WBC  --  6.7  HGB 13.9 12.7  HCT 41.0 37.7  MCV  --  84.7  PLT  --  209   Basic Metabolic Panel  Recent Labs  11/06/12 1542 11/07/12 0225  NA 133* 135  K 3.9 3.6  CL 96 100  CO2 24 25  GLUCOSE 119* 129*  BUN 8 9  CREATININE 0.59 0.67  CALCIUM 8.2* 8.2*   Liver Function Tests  Recent Labs  11/06/12 1542  AST 60*  ALT 20  ALKPHOS 107  BILITOT  0.4  PROT 6.9  ALBUMIN 3.6   Cardiac Enzymes  Recent Labs  11/06/12 1542 11/06/12 2038 11/07/12 0225  TROPONINI 8.18* >20.00* >20.00*   Hemoglobin A1C  Recent Labs  11/06/12 1542  HGBA1C 7.3*   Fasting Lipid Panel  Recent Labs  11/07/12 0225  CHOL 206*  HDL 33*  LDLCALC 132*  TRIG 204*  CHOLHDL 6.2   Thyroid Function Tests  Recent Labs  11/06/12 1542  TSH 0.664   Disposition  Pt is being discharged home today in good condition.  Follow-up Plans & Appointments      Follow-up Information   Follow up with Arlina Robes, MD. (1 wk.)    Specialty:  Family Medicine      Follow up with Antoine Poche, MD On 11/17/2012. (10:20 AM)    Specialty:  Cardiology   Contact information:   [address] [place] [address] [phone]      Discharge Medications    Medication List    STOP taking these medications       ibuprofen 200 MG tablet  Commonly known as:  ADVIL,MOTRIN  NEXIUM PO     VITAMIN E PO      TAKE these medications       acetaminophen 500 MG tablet  Commonly known as:  TYLENOL  Take 1,000 mg by mouth daily as needed for pain.     atorvastatin 80 MG tablet  Commonly known as:  LIPITOR  Take 1 tablet (80 mg total) by mouth daily at 6 PM.     diphenhydrAMINE 25 MG tablet  Commonly known as:  BENADRYL  Take 50 mg by mouth daily as needed (congestion).     enalapril 5 MG tablet  Commonly known as:  VASOTEC  Take 1 tablet (5 mg total) by mouth 2 (two) times daily.     metFORMIN 500 MG tablet  Commonly known as:  GLUCOPHAGE  Take 1 tablet (500 mg total) by mouth daily with breakfast.     metoprolol tartrate 25 MG tablet  Commonly known as:  LOPRESSOR  Take 1 tablet (25 mg total) by mouth 2 (two) times daily.     multivitamin with minerals Tabs tablet  Take 1 tablet by mouth daily.     nitroGLYCERIN 0.4 MG SL tablet  Commonly known as:  NITROSTAT  Place 1 tablet (0.4 mg total) under the tongue  every 5 (five) minutes x 3 doses as needed for chest pain.     pantoprazole 40 MG tablet  Commonly known as:  PROTONIX  Take 1 tablet (40 mg total) by mouth daily.     POTASSIUM PO  Take 1 tablet by mouth daily.     Ticagrelor 90 MG Tabs tablet  Commonly known as:  BRILINTA  Take 1 tablet (90 mg total) by mouth 2 (two) times daily.     traMADol 50 MG tablet  Commonly known as:  ULTRAM  Take 1 tablet (50 mg total) by mouth every 8 (eight) hours as needed for pain.     VITAMIN C PO  Take 1 tablet by mouth daily.       Outstanding Labs/Studies  Follow-up lipids/lft's in 8 wks (new statin).  Duration of Discharge Encounter   Greater than 30 minutes including physician time.  Signed, Nicolasa Ducking NP 11/08/2012, 10:59 AM

## 2012-11-08 NOTE — Progress Notes (Signed)
Inpatient Diabetes Program Recommendations  AACE/ADA: New Consensus Statement on Inpatient Glycemic Control (2013)  Target Ranges:  Prepandial:   less than 140 mg/dL      Peak postprandial:   less than 180 mg/dL (1-2 hours)      Critically ill patients:  140 - 180 mg/dL   Reason for Visit: New onset diabetes   Inpatient Diabetes Program Recommendations HgbA1C: Hgb A1C is 7.3 indicative of diabetes  Note:  Met with patient at bedside.  Patient talking about lots of different health problems.  Gave patient the Living With Diabetes booklet.  Informed her regarding the Patient Education Network diabetes videos.  Melina Copa, RN called in to start videos for her.  Discussed with her the need to follow- up with a PCP after discharge.  Patient knows of a possible MD close to her home, but not sure she can be accepted as a new patient.  Has Medicare but did not get Rx coverage-- in process of getting that worked out and added.    Gave her info regarding generic glucose meters in case the meter and strips she got from her deceased brother are not usable.  (Reinforced need to take meter and strips with her to PCP office visit.)  States there is a [facility] close to her home so reduced cost Metformin and generic meters should be there as well as at [employer].  Plans to go for Cardiac Rehab at [place].  Gave her information regarding outpatient diabetes class available at no charge at [facility].  Thank you.  Grae Cannata S. Elsie Lincoln, RN, CNS, CDE Inpatient Diabetes Program, team pager [phone]

## 2012-11-08 NOTE — Telephone Encounter (Signed)
TCM PATIENT -NEEDS TELEPHONE CALL WITH 48 HOURS PER DR. Excell Seltzer D/c [facility] 11-08-12

## 2012-11-08 NOTE — Discharge Summary (Signed)
Marissa Schneider, H 11/08/2012

## 2012-11-08 NOTE — Progress Notes (Signed)
Patient Name: Marissa Schneider Date of Encounter: 11/08/2012    Principal Problem:   ST elevation myocardial infarction (STEMI) of anterior wall Active Problems:   CAD (coronary artery disease)   Tobacco abuse   Hyperlipidemia   SUBJECTIVE  Reports some c/p last night.  ECG unchanged this AM.  Pain free, ambulating.  CURRENT MEDS . atorvastatin  80 mg Oral q1800  . enalapril  5 mg Oral BID  . insulin aspart  0-15 Units Subcutaneous TID WC  . metoprolol tartrate  25 mg Oral BID  . nicotine  7 mg Transdermal Daily  . pantoprazole  20 mg Oral Daily  . sodium chloride  3 mL Intravenous Q12H  . Ticagrelor  90 mg Oral BID    OBJECTIVE  Filed Vitals:   11/07/12 2126 11/08/12 0000 11/08/12 0400 11/08/12 0500  BP: 113/61 117/49 135/64   Pulse:      Temp:  98.6 F (37 C) 98.7 F (37.1 C)   TempSrc:  Oral Oral   Resp:  15 12   Height:      Weight:    164 lb 7.4 oz (74.6 kg)  SpO2:  98% 96%     Intake/Output Summary (Last 24 hours) at 11/08/12 0751 Last data filed at 11/07/12 2127  Gross per 24 hour  Intake  970.8 ml  Output      0 ml  Net  970.8 ml   Filed Weights   11/06/12 1500 11/07/12 0417 11/08/12 0500  Weight: 160 lb 0.9 oz (72.6 kg) 165 lb 9.1 oz (75.1 kg) 164 lb 7.4 oz (74.6 kg)    PHYSICAL EXAM  General: Pleasant, NAD. Neuro: Alert and oriented X 3. Moves all extremities spontaneously. Psych: Normal affect. HEENT:  Normal  Neck: Supple without bruits or JVD. Lungs:  Resp regular and unlabored, CTA. Heart: RRR no s3, s4, or murmurs. Abdomen: Soft, non-tender, non-distended, BS + x 4.  Extremities: No clubbing, cyanosis or edema. DP/PT/Radials 2+ and equal bilaterally.  Accessory Clinical Findings  CBC  Recent Labs  11/06/12 1356 11/07/12 0225  WBC  --  6.7  HGB 13.9 12.7  HCT 41.0 37.7  MCV  --  84.7  PLT  --  209   Basic Metabolic Panel  Recent Labs  11/06/12 1542 11/07/12 0225  NA 133* 135  K 3.9 3.6  CL 96 100  CO2 24 25    GLUCOSE 119* 129*  BUN 8 9  CREATININE 0.59 0.67  CALCIUM 8.2* 8.2*   Liver Function Tests  Recent Labs  11/06/12 1542  AST 60*  ALT 20  ALKPHOS 107  BILITOT 0.4  PROT 6.9  ALBUMIN 3.6   Cardiac Enzymes  Recent Labs  11/06/12 1542 11/06/12 2038 11/07/12 0225  TROPONINI 8.18* >20.00* >20.00*   Hemoglobin A1C  Recent Labs  11/06/12 1542  HGBA1C 7.3*   Fasting Lipid Panel  Recent Labs  11/07/12 0225  CHOL 206*  HDL 33*  LDLCALC 132*  TRIG 204*  CHOLHDL 6.2   Thyroid Function Tests  Recent Labs  11/06/12 1542  TSH 0.664    TELE  rsr  ECG  Rsr, 75, persistent ant st elevation with diff twi.  Radiology/Studies  No results found.  ASSESSMENT AND PLAN  1.  Acute Ant STEMI/CAD:  S/p pci/DEs to the LAD on 10/12.  Had some chest pain yesterday.  None this AM. Ambulating w/o difficulty.  Echo yesterday showed normal EF.  Cont brilinta, statin, bb, acei.  No  asa in setting of allergy.  Ambulate again this AM.  Likely home today after seen by case mgmt - ? Affordability of brilinta.  2.  DMII:  Possibly a new Dx.  She was not on meds at home before but says that she owns a glucometer with supplies.  She does not currently have a PCP in [place] but plans to see a Dr. Baldo Ash A. Winfield next week.  Will place on metformin 500mg  daily to start tomorrow.  3.  Tob Abuse:  Will not commit to quitting.  Currently using low dose nicotine patch.  4.  HL:  LDL 132.  Cont high potency statin.  Signed, Nicolasa Ducking NP    The patient was seen, examined and discussed with Ward Givens, PA-C and I agree with the above.  If the patient can't afford Ticagrelor, it might be safer to send her home on Plavix. We will see her in our clinic in 2-3 weeks.   The patient was seen, examined and discussed with Ward Givens, PA-C and I agree with the above.  11/08/2012

## 2012-11-08 NOTE — Progress Notes (Signed)
11/08/2012 11:58 AM   IV's removed, discharge instructions given and verbalized understanding.  D/C home via [place] car.  Shelbi Vaccaro, Linnell Fulling

## 2012-11-10 NOTE — Telephone Encounter (Signed)
Patient contacted regarding discharge from [facility] on November 08, 2012.  Patient does understand to follow up with provider Dr. Wyline Mood on November 17, 2012 at 10:20 am at [facility]. Patient does understand her discharge instructions. Patient does understand her medications and regimen. Patient does understand to bring all medications to this visit.  Patient denies chest pain, dizziness or sob.

## 2012-11-17 ENCOUNTER — Ambulatory Visit (INDEPENDENT_AMBULATORY_CARE_PROVIDER_SITE_OTHER): Payer: Medicare Other | Admitting: Cardiology

## 2012-11-17 ENCOUNTER — Encounter: Payer: Self-pay | Admitting: Cardiology

## 2012-11-17 VITALS — BP 118/57 | HR 68 | Ht 71.0 in | Wt 158.0 lb

## 2012-11-17 DIAGNOSIS — I2510 Atherosclerotic heart disease of native coronary artery without angina pectoris: Secondary | ICD-10-CM

## 2012-11-17 NOTE — Patient Instructions (Addendum)
Your physician recommends that you schedule a follow-up appointment in: 3 month with Dr. Wyline Mood. You will schedule this today before you leave.  Your physician recommends that you continue on your current medications as directed. Please refer to the Current Medication list given to you today.   A referral has been given to you today for cardiac rehab at [facility].

## 2012-11-17 NOTE — Progress Notes (Addendum)
Clinical Summary Ms. Pollio is a 67 y.o.female seen as a new patient today  1. STEMI - patient admitted to [facility] 10/12-10/14/14 with anterior wall STEMI, LVEF by LV gram 45% with anterior wall hypokinesis. LVEF 55% by echo with apical hypokinesis - s/p DES to mid LAD - notes some mild chest discomfort when first got home from hospital, no recent episodes. Notes some SOB that comes on at times, resolves after taking 1-2 deep breaths  - compliant with meds: atorva, enalapril, metoprolol, ticagrelor. She has an aspirin allergy-->rash/hives  2. HL - just initiated on high dose statin during admission Panel 11/07/12: TC 206 TG 204 HDL 33 LDL 132  3. DM - newly diagnosed with Hgb A1c of 7.3 during hospital admission - was to establish outpatient care, has appt October 27 with PCP.  4. Tobacco abuse - has not smoked since her MI    Past Medical History  Diagnosis Date  . CAD (coronary artery disease)     a. 10/2012 Ant STEMI/PCI: LM 20-30d, LAD 50/51m (2.5x24 Promus Premier DES), D1 50ost, LCX 50ost, OM1 50/50, RCa 76m/d, 50-70d @ bifurcation of PDA/PLA, EF 45%.  . Tobacco abuse   . Hyperlipidemia   . Myocardial infarction   . Type II diabetes mellitus, uncontrolled 11/08/2012     Allergies  Allergen Reactions  . Cheese Hives    On chin  . Aspirin Rash     Current Outpatient Prescriptions  Medication Sig Dispense Refill  . acetaminophen (TYLENOL) 500 MG tablet Take 1,000 mg by mouth daily as needed for pain.      . Ascorbic Acid (VITAMIN C PO) Take 1 tablet by mouth daily.      Marland Kitchen atorvastatin (LIPITOR) 80 MG tablet Take 1 tablet (80 mg total) by mouth daily at 6 PM.  30 tablet  6  . diphenhydrAMINE (BENADRYL) 25 MG tablet Take 50 mg by mouth daily as needed (congestion).      . enalapril (VASOTEC) 5 MG tablet Take 1 tablet (5 mg total) by mouth 2 (two) times daily.  60 tablet  6  . metFORMIN (GLUCOPHAGE) 500 MG tablet Take 1 tablet (500 mg total) by mouth daily  with breakfast.  30 tablet  3  . metoprolol tartrate (LOPRESSOR) 25 MG tablet Take 1 tablet (25 mg total) by mouth 2 (two) times daily.  60 tablet  6  . Multiple Vitamin (MULTIVITAMIN WITH MINERALS) TABS tablet Take 1 tablet by mouth daily.      . nitroGLYCERIN (NITROSTAT) 0.4 MG SL tablet Place 1 tablet (0.4 mg total) under the tongue every 5 (five) minutes x 3 doses as needed for chest pain.  25 tablet  3  . pantoprazole (PROTONIX) 40 MG tablet Take 1 tablet (40 mg total) by mouth daily.  30 tablet  3  . POTASSIUM PO Take 1 tablet by mouth daily.      . Ticagrelor (BRILINTA) 90 MG TABS tablet Take 1 tablet (90 mg total) by mouth 2 (two) times daily.  60 tablet  6  . traMADol (ULTRAM) 50 MG tablet Take 1 tablet (50 mg total) by mouth every 8 (eight) hours as needed for pain.  30 tablet  0   No current facility-administered medications for this visit.     Past Surgical History  Procedure Laterality Date  . Laparoscopy with tubal ligation    . Tubal ligation       Allergies  Allergen Reactions  . Cheese Hives  On chin  . Aspirin Rash      No family history on file.   Social History Ms. Neuser reports that she has been smoking Cigarettes.  She has a 50 pack-year smoking history. She has never used smokeless tobacco. Ms. Faw reports that she drinks alcohol.   Review of Systems CONSTITUTIONAL: No weight loss, fever, chills, weakness or fatigue.  HEENT: Eyes: No visual loss, blurred vision, double vision or yellow sclerae.No hearing loss, sneezing, congestion, runny nose or sore throat.  SKIN: No rash or itching.  CARDIOVASCULAR: per HPI RESPIRATORY: per HPI GASTROINTESTINAL: No anorexia, nausea, vomiting or diarrhea. No abdominal pain or blood.  GENITOURINARY: No burning on urination, no polyuria NEUROLOGICAL: No headache, dizziness, syncope, paralysis, ataxia, numbness or tingling in the extremities. No change in bowel or bladder control.  MUSCULOSKELETAL: No muscle,  back pain, joint pain or stiffness.  LYMPHATICS: No enlarged nodes. No history of splenectomy.  PSYCHIATRIC: No history of depression or anxiety.  ENDOCRINOLOGIC: No reports of sweating, cold or heat intolerance. No polyuria or polydipsia.  Marland Kitchen   Physical Examination p 69 bp 118/57 Wt 158 lbs BMI 22 Gen: resting comfortably, no acute distress HEENT: no scleral icterus, pupils equal round and reactive, no palptable cervical adenopathy,  CV: RRR, no m/r/g, no JVD, no carotid bruits Resp: Clear to auscultation bilaterally GI: abdomen is soft, non-tender, non-distended, normal bowel sounds, no hepatosplenomegaly MSK: extremities are warm, no edema.  Skin: warm, no rash Neuro:  no focal deficits Psych: appropriate affect   Diagnostic Studies 11/06/12 Cath Cardiac Catheterization and Percutaneous Coronary Intervention 10.12.2014  Hemodynamics:  AO 175/88  LV 177/16  Coronary angiography:  Coronary dominance: right  Left mainstem: Patent vessel with 20-30% distal LM stenosis  Left anterior descending (LAD): Proximal irregularity. There is severe disease in the mid-LAD. Diffuse 50% stenosis leading into a 99% mid-vessel stenosis. The first diagonal is large with 50% ostial stenosis and it originates at the proximal aspect of LAD plaque. The distal LAD is tortuous and widely patent as it wraps the LV apex.  **The mid-LAD was successfully stented using a 2.5x24 mm Promus Premier drug-eluting stent.**  Left circumflex (LCx): 50% ostial stenosis, 50% stenosis of OM1 with sequential lesions  Right coronary artery (RCA): normal caliber, dominant vessel. There is diffuse disease noted. There is 50% mid-vessel stenosis and 50-70% bifurcational disease at the PDA/PLA origin.  Left ventriculography: Left ventricular systolic function is moderately depressed. There is hypokinesis of the distal anterior wall and anteroapex. The LVEF is estimated at 45%.  Final Conclusions:  1. Severe mid-LAD stenosis  treated successfully with primary PCI using a DES  2. Moderate RCA and LCx stenosis  3. Moderate LV dysfunction   11/07/12 Echo: LVEF 55%, mild LVH, diastolic dysfunction without grade specified, hypokinesis apical anteroseptal and apical inferoseptal segments.    11/17/12 Clinic EKG SR, normal axis, anteroseptal Q waves, diffuse T wave inversions Assessment and Plan  1. CAD - recent STEMI s/p DES to LAD, on brillinta only due to significant aspirin allergy - no significant current symptoms - continue current medical therapy - will refer to cardiac rehab. Also provided her with another packet for brilinta drug assistances proram.   2. HL - just initiated on statin therapy during admission - will need repeat labs at next visit.  3. DM - newly diagnosed, initiated on metformin at hospital discharge - she has follow up with her PCP next week for further management  4. Tobacco abuse - patient has  successfully quit     Antoine Poche, M.D., F.A.C.C.

## 2012-12-14 ENCOUNTER — Telehealth: Payer: Self-pay | Admitting: Cardiology

## 2012-12-14 NOTE — Telephone Encounter (Signed)
Received fax from [employer] requesting medications that pt was allergic to, medication that patient was on, and health conditions that pt had. Filled out form and faxed back to [employer]. Placed in basket to be scanned.

## 2012-12-20 ENCOUNTER — Encounter (HOSPITAL_COMMUNITY): Payer: Self-pay

## 2012-12-20 ENCOUNTER — Encounter (HOSPITAL_COMMUNITY)
Admission: RE | Admit: 2012-12-20 | Discharge: 2012-12-20 | Disposition: A | Discharge status: Home / Self Care | Payer: Medicare Other | Source: Ambulatory Visit | Attending: Cardiology | Admitting: Cardiology

## 2012-12-20 VITALS — BP 120/50 | HR 70 | Ht 71.0 in | Wt 163.3 lb

## 2012-12-20 DIAGNOSIS — I214 Non-ST elevation (NSTEMI) myocardial infarction: Secondary | ICD-10-CM

## 2012-12-20 DIAGNOSIS — Z9861 Coronary angioplasty status: Secondary | ICD-10-CM

## 2012-12-20 NOTE — Progress Notes (Signed)
Patient was referred to Cardiac Rehab by Dr. Dina Rich due to NSTEMI and Stent x 1. During orientation advised patient on arrival and appointment times what to wear, what to do before, during and after exercise. Reviewed attendance and class policy. Talked about inclement weather and class consultation policy. Pt is scheduled to start Cardiac Rehab on 12/26/12 at 11 am. Pt was advised to come to class 5 minutes before class starts. He was also given instructions on meeting with the dietician and attending the Family Structure classes. Pt is eager to get started. Patient was able to do the 6 minute walk test.

## 2012-12-20 NOTE — Patient Instructions (Signed)
Pt has finished orientation and is scheduled to start CR on 12/26/12 at 11 am. Pt has been instructed to arrive to class 15 minutes early for scheduled class. Pt has been instructed to wear comfortable clothing and shoes with rubber soles. Pt has been told to take their medications 1 hour prior to coming to class.  If the patient is not going to attend class, he/she has been instructed to call.

## 2012-12-26 ENCOUNTER — Encounter (HOSPITAL_COMMUNITY)
Admission: RE | Admit: 2012-12-26 | Discharge: 2012-12-26 | Disposition: A | Discharge status: Home / Self Care | Payer: Medicare Other | Source: Ambulatory Visit | Attending: Cardiology | Admitting: Cardiology

## 2012-12-26 DIAGNOSIS — Z9861 Coronary angioplasty status: Secondary | ICD-10-CM | POA: Insufficient documentation

## 2012-12-26 DIAGNOSIS — I2510 Atherosclerotic heart disease of native coronary artery without angina pectoris: Secondary | ICD-10-CM | POA: Insufficient documentation

## 2012-12-26 DIAGNOSIS — I214 Non-ST elevation (NSTEMI) myocardial infarction: Secondary | ICD-10-CM | POA: Insufficient documentation

## 2012-12-26 DIAGNOSIS — Z5189 Encounter for other specified aftercare: Secondary | ICD-10-CM | POA: Insufficient documentation

## 2012-12-28 ENCOUNTER — Encounter (HOSPITAL_COMMUNITY)
Admission: RE | Admit: 2012-12-28 | Discharge: 2012-12-28 | Disposition: A | Discharge status: Home / Self Care | Payer: Medicare Other | Source: Ambulatory Visit | Attending: Cardiology | Admitting: Cardiology

## 2012-12-30 ENCOUNTER — Encounter (HOSPITAL_COMMUNITY)
Admission: RE | Admit: 2012-12-30 | Discharge: 2012-12-30 | Disposition: A | Discharge status: Home / Self Care | Payer: Medicare Other | Source: Ambulatory Visit | Attending: Cardiology | Admitting: Cardiology

## 2013-01-02 ENCOUNTER — Encounter (HOSPITAL_COMMUNITY)
Admission: RE | Admit: 2013-01-02 | Discharge: 2013-01-02 | Disposition: A | Discharge status: Home / Self Care | Payer: Medicare Other | Source: Ambulatory Visit | Attending: Cardiology | Admitting: Cardiology

## 2013-01-04 ENCOUNTER — Encounter (HOSPITAL_COMMUNITY)
Admission: RE | Admit: 2013-01-04 | Discharge: 2013-01-04 | Disposition: A | Discharge status: Home / Self Care | Payer: Medicare Other | Source: Ambulatory Visit | Attending: Cardiology | Admitting: Cardiology

## 2013-01-06 ENCOUNTER — Encounter (HOSPITAL_COMMUNITY)
Admission: RE | Admit: 2013-01-06 | Discharge: 2013-01-06 | Disposition: A | Discharge status: Home / Self Care | Payer: Medicare Other | Source: Ambulatory Visit | Attending: Cardiology | Admitting: Cardiology

## 2013-01-09 ENCOUNTER — Encounter (HOSPITAL_COMMUNITY): Payer: Medicare Other

## 2013-01-11 ENCOUNTER — Encounter (HOSPITAL_COMMUNITY)
Admission: RE | Admit: 2013-01-11 | Discharge: 2013-01-11 | Disposition: A | Discharge status: Home / Self Care | Payer: Medicare Other | Source: Ambulatory Visit | Attending: Cardiology | Admitting: Cardiology

## 2013-01-13 ENCOUNTER — Encounter (HOSPITAL_COMMUNITY)
Admission: RE | Admit: 2013-01-13 | Discharge: 2013-01-13 | Disposition: A | Discharge status: Home / Self Care | Payer: Medicare Other | Source: Ambulatory Visit | Attending: Cardiology | Admitting: Cardiology

## 2013-01-16 ENCOUNTER — Encounter (HOSPITAL_COMMUNITY)
Admission: RE | Admit: 2013-01-16 | Discharge: 2013-01-16 | Disposition: A | Discharge status: Home / Self Care | Payer: Medicare Other | Source: Ambulatory Visit | Attending: Cardiology | Admitting: Cardiology

## 2013-01-18 ENCOUNTER — Encounter (HOSPITAL_COMMUNITY)
Admission: RE | Admit: 2013-01-18 | Discharge: 2013-01-18 | Disposition: A | Discharge status: Home / Self Care | Payer: Medicare Other | Source: Ambulatory Visit | Attending: Cardiology | Admitting: Cardiology

## 2013-01-20 ENCOUNTER — Encounter (HOSPITAL_COMMUNITY)
Admission: RE | Admit: 2013-01-20 | Discharge: 2013-01-20 | Disposition: A | Discharge status: Home / Self Care | Payer: Medicare Other | Source: Ambulatory Visit | Attending: Cardiology | Admitting: Cardiology

## 2013-01-23 ENCOUNTER — Encounter (HOSPITAL_COMMUNITY)
Admission: RE | Admit: 2013-01-23 | Discharge: 2013-01-23 | Disposition: A | Discharge status: Home / Self Care | Payer: Medicare Other | Source: Ambulatory Visit | Attending: Cardiology | Admitting: Cardiology

## 2013-01-25 ENCOUNTER — Encounter (HOSPITAL_COMMUNITY)
Admission: RE | Admit: 2013-01-25 | Discharge: 2013-01-25 | Disposition: A | Discharge status: Home / Self Care | Payer: Medicare Other | Source: Ambulatory Visit | Attending: Cardiology | Admitting: Cardiology

## 2013-01-27 ENCOUNTER — Encounter (HOSPITAL_COMMUNITY)
Admission: RE | Admit: 2013-01-27 | Discharge: 2013-01-27 | Disposition: A | Discharge status: Home / Self Care | Payer: Medicare Other | Source: Ambulatory Visit | Attending: Cardiology | Admitting: Cardiology

## 2013-01-27 DIAGNOSIS — I2510 Atherosclerotic heart disease of native coronary artery without angina pectoris: Secondary | ICD-10-CM | POA: Diagnosis not present

## 2013-01-27 DIAGNOSIS — I214 Non-ST elevation (NSTEMI) myocardial infarction: Secondary | ICD-10-CM | POA: Diagnosis not present

## 2013-01-27 DIAGNOSIS — Z5189 Encounter for other specified aftercare: Secondary | ICD-10-CM | POA: Insufficient documentation

## 2013-01-27 DIAGNOSIS — Z9861 Coronary angioplasty status: Secondary | ICD-10-CM | POA: Insufficient documentation

## 2013-01-30 ENCOUNTER — Encounter (HOSPITAL_COMMUNITY): Payer: Medicare Other

## 2013-02-01 ENCOUNTER — Encounter (HOSPITAL_COMMUNITY): Payer: Medicare Other

## 2013-02-03 ENCOUNTER — Encounter (HOSPITAL_COMMUNITY): Payer: Medicare Other

## 2013-02-06 ENCOUNTER — Encounter (HOSPITAL_COMMUNITY)
Admission: RE | Admit: 2013-02-06 | Discharge: 2013-02-06 | Disposition: A | Discharge status: Home / Self Care | Payer: Medicare Other | Source: Ambulatory Visit | Attending: Cardiology | Admitting: Cardiology

## 2013-02-06 DIAGNOSIS — Z5189 Encounter for other specified aftercare: Secondary | ICD-10-CM | POA: Diagnosis not present

## 2013-02-06 DIAGNOSIS — I2510 Atherosclerotic heart disease of native coronary artery without angina pectoris: Secondary | ICD-10-CM | POA: Diagnosis not present

## 2013-02-06 DIAGNOSIS — Z9861 Coronary angioplasty status: Secondary | ICD-10-CM | POA: Diagnosis not present

## 2013-02-06 DIAGNOSIS — I214 Non-ST elevation (NSTEMI) myocardial infarction: Secondary | ICD-10-CM | POA: Diagnosis not present

## 2013-02-08 ENCOUNTER — Ambulatory Visit: Payer: Medicare Other | Admitting: Cardiology

## 2013-02-08 ENCOUNTER — Encounter (HOSPITAL_COMMUNITY): Payer: Medicare Other

## 2013-02-10 ENCOUNTER — Encounter (HOSPITAL_COMMUNITY)
Admission: RE | Admit: 2013-02-10 | Discharge: 2013-02-10 | Disposition: A | Discharge status: Home / Self Care | Payer: Medicare Other | Source: Ambulatory Visit | Attending: Cardiology | Admitting: Cardiology

## 2013-02-10 DIAGNOSIS — I2510 Atherosclerotic heart disease of native coronary artery without angina pectoris: Secondary | ICD-10-CM | POA: Diagnosis not present

## 2013-02-10 DIAGNOSIS — Z9861 Coronary angioplasty status: Secondary | ICD-10-CM | POA: Diagnosis not present

## 2013-02-10 DIAGNOSIS — I214 Non-ST elevation (NSTEMI) myocardial infarction: Secondary | ICD-10-CM | POA: Diagnosis not present

## 2013-02-10 DIAGNOSIS — Z5189 Encounter for other specified aftercare: Secondary | ICD-10-CM | POA: Diagnosis not present

## 2013-02-13 ENCOUNTER — Encounter (HOSPITAL_COMMUNITY): Payer: Medicare Other

## 2013-02-15 ENCOUNTER — Other Ambulatory Visit (HOSPITAL_COMMUNITY): Payer: Self-pay | Admitting: Nurse Practitioner

## 2013-02-15 ENCOUNTER — Encounter (HOSPITAL_COMMUNITY)
Admission: RE | Admit: 2013-02-15 | Discharge: 2013-02-15 | Disposition: A | Discharge status: Home / Self Care | Payer: Medicare Other | Source: Ambulatory Visit | Attending: Cardiology | Admitting: Cardiology

## 2013-02-15 DIAGNOSIS — Z5189 Encounter for other specified aftercare: Secondary | ICD-10-CM | POA: Diagnosis not present

## 2013-02-15 DIAGNOSIS — Z9861 Coronary angioplasty status: Secondary | ICD-10-CM | POA: Diagnosis not present

## 2013-02-15 DIAGNOSIS — I2510 Atherosclerotic heart disease of native coronary artery without angina pectoris: Secondary | ICD-10-CM | POA: Diagnosis not present

## 2013-02-15 DIAGNOSIS — I214 Non-ST elevation (NSTEMI) myocardial infarction: Secondary | ICD-10-CM | POA: Diagnosis not present

## 2013-02-17 ENCOUNTER — Encounter (HOSPITAL_COMMUNITY)
Admission: RE | Admit: 2013-02-17 | Discharge: 2013-02-17 | Disposition: A | Discharge status: Home / Self Care | Payer: Medicare Other | Source: Ambulatory Visit | Attending: Cardiology | Admitting: Cardiology

## 2013-02-17 DIAGNOSIS — I2510 Atherosclerotic heart disease of native coronary artery without angina pectoris: Secondary | ICD-10-CM | POA: Diagnosis not present

## 2013-02-17 DIAGNOSIS — Z9861 Coronary angioplasty status: Secondary | ICD-10-CM | POA: Diagnosis not present

## 2013-02-17 DIAGNOSIS — I214 Non-ST elevation (NSTEMI) myocardial infarction: Secondary | ICD-10-CM | POA: Diagnosis not present

## 2013-02-17 DIAGNOSIS — Z5189 Encounter for other specified aftercare: Secondary | ICD-10-CM | POA: Diagnosis not present

## 2013-02-20 ENCOUNTER — Encounter (HOSPITAL_COMMUNITY)
Admission: RE | Admit: 2013-02-20 | Discharge: 2013-02-20 | Disposition: A | Discharge status: Home / Self Care | Payer: Medicare Other | Source: Ambulatory Visit | Attending: Cardiology | Admitting: Cardiology

## 2013-02-20 DIAGNOSIS — Z5189 Encounter for other specified aftercare: Secondary | ICD-10-CM | POA: Diagnosis not present

## 2013-02-20 DIAGNOSIS — I214 Non-ST elevation (NSTEMI) myocardial infarction: Secondary | ICD-10-CM | POA: Diagnosis not present

## 2013-02-20 DIAGNOSIS — Z9861 Coronary angioplasty status: Secondary | ICD-10-CM | POA: Diagnosis not present

## 2013-02-20 DIAGNOSIS — I2510 Atherosclerotic heart disease of native coronary artery without angina pectoris: Secondary | ICD-10-CM | POA: Diagnosis not present

## 2013-02-22 ENCOUNTER — Encounter (HOSPITAL_COMMUNITY)
Admission: RE | Admit: 2013-02-22 | Discharge: 2013-02-22 | Disposition: A | Discharge status: Home / Self Care | Payer: Medicare Other | Source: Ambulatory Visit | Attending: Cardiology | Admitting: Cardiology

## 2013-02-22 DIAGNOSIS — Z9861 Coronary angioplasty status: Secondary | ICD-10-CM | POA: Diagnosis not present

## 2013-02-22 DIAGNOSIS — I2510 Atherosclerotic heart disease of native coronary artery without angina pectoris: Secondary | ICD-10-CM | POA: Diagnosis not present

## 2013-02-22 DIAGNOSIS — Z5189 Encounter for other specified aftercare: Secondary | ICD-10-CM | POA: Diagnosis not present

## 2013-02-22 DIAGNOSIS — I214 Non-ST elevation (NSTEMI) myocardial infarction: Secondary | ICD-10-CM | POA: Diagnosis not present

## 2013-02-24 ENCOUNTER — Encounter (HOSPITAL_COMMUNITY)
Admission: RE | Admit: 2013-02-24 | Discharge: 2013-02-24 | Disposition: A | Discharge status: Home / Self Care | Payer: Medicare Other | Source: Ambulatory Visit | Attending: Cardiology | Admitting: Cardiology

## 2013-02-24 DIAGNOSIS — I214 Non-ST elevation (NSTEMI) myocardial infarction: Secondary | ICD-10-CM | POA: Diagnosis not present

## 2013-02-24 DIAGNOSIS — Z9861 Coronary angioplasty status: Secondary | ICD-10-CM | POA: Diagnosis not present

## 2013-02-24 DIAGNOSIS — Z5189 Encounter for other specified aftercare: Secondary | ICD-10-CM | POA: Diagnosis not present

## 2013-02-24 DIAGNOSIS — I2510 Atherosclerotic heart disease of native coronary artery without angina pectoris: Secondary | ICD-10-CM | POA: Diagnosis not present

## 2013-02-25 ENCOUNTER — Other Ambulatory Visit: Payer: Self-pay | Admitting: Nurse Practitioner

## 2013-02-27 ENCOUNTER — Encounter (HOSPITAL_COMMUNITY)
Admission: RE | Admit: 2013-02-27 | Discharge: 2013-02-27 | Disposition: A | Discharge status: Home / Self Care | Payer: Medicare Other | Source: Ambulatory Visit | Attending: Cardiology | Admitting: Cardiology

## 2013-02-27 DIAGNOSIS — I2510 Atherosclerotic heart disease of native coronary artery without angina pectoris: Secondary | ICD-10-CM | POA: Insufficient documentation

## 2013-02-27 DIAGNOSIS — Z5189 Encounter for other specified aftercare: Secondary | ICD-10-CM | POA: Insufficient documentation

## 2013-02-27 DIAGNOSIS — I252 Old myocardial infarction: Secondary | ICD-10-CM | POA: Insufficient documentation

## 2013-02-27 DIAGNOSIS — E785 Hyperlipidemia, unspecified: Secondary | ICD-10-CM | POA: Insufficient documentation

## 2013-02-27 DIAGNOSIS — E119 Type 2 diabetes mellitus without complications: Secondary | ICD-10-CM | POA: Insufficient documentation

## 2013-02-27 DIAGNOSIS — F17200 Nicotine dependence, unspecified, uncomplicated: Secondary | ICD-10-CM | POA: Diagnosis not present

## 2013-03-01 ENCOUNTER — Encounter (HOSPITAL_COMMUNITY): Payer: Medicare Other

## 2013-03-03 ENCOUNTER — Encounter (HOSPITAL_COMMUNITY)
Admission: RE | Admit: 2013-03-03 | Discharge: 2013-03-03 | Disposition: A | Discharge status: Home / Self Care | Payer: Medicare Other | Source: Ambulatory Visit | Attending: Cardiology | Admitting: Cardiology

## 2013-03-06 ENCOUNTER — Encounter (HOSPITAL_COMMUNITY)
Admission: RE | Admit: 2013-03-06 | Discharge: 2013-03-06 | Disposition: A | Discharge status: Home / Self Care | Payer: Medicare Other | Source: Ambulatory Visit | Attending: Cardiology | Admitting: Cardiology

## 2013-03-08 ENCOUNTER — Encounter (HOSPITAL_COMMUNITY)
Admission: RE | Admit: 2013-03-08 | Discharge: 2013-03-08 | Disposition: A | Discharge status: Home / Self Care | Payer: Medicare Other | Source: Ambulatory Visit | Attending: Cardiology | Admitting: Cardiology

## 2013-03-08 ENCOUNTER — Ambulatory Visit (INDEPENDENT_AMBULATORY_CARE_PROVIDER_SITE_OTHER): Payer: Medicare Other | Admitting: Cardiology

## 2013-03-08 ENCOUNTER — Encounter: Payer: Self-pay | Admitting: *Deleted

## 2013-03-08 ENCOUNTER — Encounter: Payer: Self-pay | Admitting: Cardiology

## 2013-03-08 VITALS — BP 115/62 | HR 68 | Ht 70.0 in | Wt 152.0 lb

## 2013-03-08 DIAGNOSIS — I2510 Atherosclerotic heart disease of native coronary artery without angina pectoris: Secondary | ICD-10-CM

## 2013-03-08 DIAGNOSIS — E782 Mixed hyperlipidemia: Secondary | ICD-10-CM

## 2013-03-08 NOTE — Patient Instructions (Signed)
Your physician wants you to follow-up in: 6 MONTHS You will receive a reminder letter in the mail two months in advance. If you don't receive a letter, please call our office to schedule the follow-up appointment.  Your physician recommends that you return for lab work in: [place] WITH YOUR TEST RESULTS/INSTRUCTIONS/NEXT STEPS ONCE RECEIVED BY THE PROVIDER

## 2013-03-08 NOTE — Progress Notes (Signed)
Clinical Summary Ms. Bushnell is a 68 y.o.female seen today for follow up of the following medical problems.   1. CAD - ppatient admitted to [facility] 10/12-10/14/14 with anterior wall STEMI, LVEF by LV gram 45% with anterior wall hypokinesis. LVEF 55% by echo with apical hypokinesis  - s/p DES to mid LAD  - no chest pain recently - compliant with meds: atorva, enalapril, metoprolol, ticagrelor, ASA  2. HL  - just initiated on high dose statin during admission  Panel 11/07/12: TC 206 TG 204 HDL 33 LDL 132   3. DM  - newly diagnosed with Hgb A1c of 7.3 during hospital admission  - was to establish outpatient care, has appt October 27 with PCP.   4. Tobacco abuse  - has not smoked since her MI   Past Medical History  Diagnosis Date  . CAD (coronary artery disease)     a. 10/2012 Ant STEMI/PCI: LM 20-30d, LAD 50/1m (2.5x24 Promus Premier DES), D1 50ost, LCX 50ost, OM1 50/50, RCa 85m/d, 50-70d @ bifurcation of PDA/PLA, EF 45%.  . Tobacco abuse   . Hyperlipidemia   . Myocardial infarction   . Type II diabetes mellitus, uncontrolled 11/08/2012     Allergies  Allergen Reactions  . Cheese Hives    On chin  . Aspirin Rash     Current Outpatient Prescriptions  Medication Sig Dispense Refill  . acetaminophen (TYLENOL) 500 MG tablet Take 1,000 mg by mouth daily as needed for pain.      . Ascorbic Acid (VITAMIN C PO) Take 1 tablet by mouth daily.      Marland Kitchen atorvastatin (LIPITOR) 80 MG tablet Take 1 tablet (80 mg total) by mouth daily at 6 PM.  30 tablet  6  . diphenhydrAMINE (BENADRYL) 25 MG tablet Take 50 mg by mouth daily as needed (congestion).      . enalapril (VASOTEC) 5 MG tablet Take 1 tablet (5 mg total) by mouth 2 (two) times daily.  60 tablet  6  . metFORMIN (GLUCOPHAGE) 500 MG tablet Take 1 tablet (500 mg total) by mouth daily with breakfast.  30 tablet  3  . metoprolol tartrate (LOPRESSOR) 25 MG tablet Take 1 tablet (25 mg total) by mouth 2 (two) times daily.  60  tablet  6  . Multiple Vitamin (MULTIVITAMIN WITH MINERALS) TABS tablet Take 1 tablet by mouth daily.      . nitroGLYCERIN (NITROSTAT) 0.4 MG SL tablet Place 1 tablet (0.4 mg total) under the tongue every 5 (five) minutes x 3 doses as needed for chest pain.  25 tablet  3  . pantoprazole (PROTONIX) 40 MG tablet TAKE 1 TABLET BY MOUTH EVERY DAY  30 tablet  6  . POTASSIUM PO Take 1 tablet by mouth daily.      . Ticagrelor (BRILINTA) 90 MG TABS tablet Take 1 tablet (90 mg total) by mouth 2 (two) times daily.  60 tablet  6  . traMADol (ULTRAM) 50 MG tablet Take 1 tablet (50 mg total) by mouth every 8 (eight) hours as needed for pain.  30 tablet  0   No current facility-administered medications for this visit.     Past Surgical History  Procedure Laterality Date  . Laparoscopy with tubal ligation    . Tubal ligation    . Coronary stent placement       Allergies  Allergen Reactions  . Cheese Hives    On chin  . Aspirin Rash  No family history on file.   Social History Ms. Tate reports that she quit smoking about 4 months ago. Her smoking use included Cigarettes. She has a 50 pack-year smoking history. She has never used smokeless tobacco. Ms. Maina reports that she drinks alcohol.   Review of Systems CONSTITUTIONAL: No weight loss, fever, chills, weakness or fatigue.  HEENT: Eyes: No visual loss, blurred vision, double vision or yellow sclerae.No hearing loss, sneezing, congestion, runny nose or sore throat.  SKIN: No rash or itching.  CARDIOVASCULAR: per HPI RESPIRATORY: No shortness of breath, cough or sputum.  GASTROINTESTINAL: No anorexia, nausea, vomiting or diarrhea. No abdominal pain or blood.  GENITOURINARY: No burning on urination, no polyuria NEUROLOGICAL: No headache, dizziness, syncope, paralysis, ataxia, numbness or tingling in the extremities. No change in bowel or bladder control.  MUSCULOSKELETAL: No muscle, back pain, joint pain or stiffness.    LYMPHATICS: No enlarged nodes. No history of splenectomy.  PSYCHIATRIC: No history of depression or anxiety.  ENDOCRINOLOGIC: No reports of sweating, cold or heat intolerance. No polyuria or polydipsia.  Marland Kitchen   Physical Examination p 68 bp 115/62 Wt 152 lbs BMI 22 Gen: resting comfortably, no acute distress HEENT: no scleral icterus, pupils equal round and reactive, no palptable cervical adenopathy,  CV: RRR, no m/r/g, no JVD, no carotid bruits Resp: Clear to auscultation bilaterally GI: abdomen is soft, non-tender, non-distended, normal bowel sounds, no hepatosplenomegaly MSK: extremities are warm, no edema.  Skin: warm, no rash Neuro:  no focal deficits Psych: appropriate affect   Diagnostic Studies 11/06/12 Cath  Cardiac Catheterization and Percutaneous Coronary Intervention 10.12.2014  Hemodynamics:  AO 175/88  LV 177/16  Coronary angiography:  Coronary dominance: right  Left mainstem: Patent vessel with 20-30% distal LM stenosis  Left anterior descending (LAD): Proximal irregularity. There is severe disease in the mid-LAD. Diffuse 50% stenosis leading into a 99% mid-vessel stenosis. The first diagonal is large with 50% ostial stenosis and it originates at the proximal aspect of LAD plaque. The distal LAD is tortuous and widely patent as it wraps the LV apex.  **The mid-LAD was successfully stented using a 2.5x24 mm Promus Premier drug-eluting stent.**  Left circumflex (LCx): 50% ostial stenosis, 50% stenosis of OM1 with sequential lesions  Right coronary artery (RCA): normal caliber, dominant vessel. There is diffuse disease noted. There is 50% mid-vessel stenosis and 50-70% bifurcational disease at the PDA/PLA origin.  Left ventriculography: Left ventricular systolic function is moderately depressed. There is hypokinesis of the distal anterior wall and anteroapex. The LVEF is estimated at 45%.  Final Conclusions:  1. Severe mid-LAD stenosis treated successfully with primary PCI  using a DES  2. Moderate RCA and LCx stenosis  3. Moderate LV dysfunction  11/07/12 Echo: LVEF 55%, mild LVH, diastolic dysfunction without grade specified, hypokinesis apical anteroseptal and apical inferoseptal segments.   11/17/12 Clinic EKG  SR, normal axis, anteroseptal Q waves, diffuse T wave inversions     Assessment and Plan  1. CAD  - recent STEMI s/p DES to LAD, DAPT at least until 10/2013.  - no significant current symptoms  - continue current medical therapy    2. HL  - just initiated on statin therapy during admission 10/2012  - will repeat lipid panel  3. DM  - newly diagnosed, initiated on metformin at hospital discharge  - she has established with a PCP for further managment  4. Tobacco abuse  - patient has successfully quit   Follow up 6 months  Arnoldo Lenis, M.D., F.A.C.C.

## 2013-03-10 ENCOUNTER — Encounter (HOSPITAL_COMMUNITY)
Admission: RE | Admit: 2013-03-10 | Discharge: 2013-03-10 | Disposition: A | Discharge status: Home / Self Care | Payer: Medicare Other | Source: Ambulatory Visit | Attending: Cardiology | Admitting: Cardiology

## 2013-03-13 ENCOUNTER — Encounter (HOSPITAL_COMMUNITY)
Admission: RE | Admit: 2013-03-13 | Discharge: 2013-03-13 | Disposition: A | Discharge status: Home / Self Care | Payer: Medicare Other | Source: Ambulatory Visit | Attending: Cardiology | Admitting: Cardiology

## 2013-03-15 ENCOUNTER — Encounter (HOSPITAL_COMMUNITY)
Admission: RE | Admit: 2013-03-15 | Discharge: 2013-03-15 | Disposition: A | Discharge status: Home / Self Care | Payer: Medicare Other | Source: Ambulatory Visit | Attending: Cardiology | Admitting: Cardiology

## 2013-03-15 NOTE — Progress Notes (Signed)
Cardiac Rehabilitation Program Outcomes Report   Orientation:  12/20/2012 1st week Report: 12/052014 Graduate Date:  tbd Discharge Date:  tbd # of sessions completed: 3 DX NSTEMI and StentX1/CAD  Cardiologist: Carlyle Dolly Family MD:  Dr. Nino Glow Time:  11:00  A.  Exercise Program:  Tolerates exercise @ 2.50 METS for 15 minutes and Walk Test Results:  Pre: Pre walk test:Rest HR 70, BP 120/50, O2 98%, RPE 6 and RPD 6, 6  Min HR 91, BP 160/58 O2 98% RPE 9 and RPD 8, Post HR 62, BP 120/60, O2 100% RPE 6 and RPD 6. Walked 1244feet at 2.70mphat 2.7 METS.  B.  Mental Health:  Good mental attitude  C.  Education/Instruction/Skills  Accurately checks own pulse.  Rest:  81  Exercise:  103, Knows THR for exercise and Uses Perceived Exertion Scale and/or Dyspnea Scale  Uses Perceived Exertion Scale and/or Dyspnea Scale  D.  Nutrition/Weight Control/Body Composition:  Adherence to prescribed nutrition program: good    E.  Blood Lipids    Lab Results  Component Value Date   CHOL 206* 11/07/2012   HDL 33* 11/07/2012   LDLCALC 132* 11/07/2012   TRIG 204* 11/07/2012   CHOLHDL 6.2 11/07/2012    F.  Lifestyle Changes:  Making positive lifestyle changes and Not smoking:  Quit did not say when she quit  G.  Symptoms noted with exercise:  Asymptomatic  Report Completed By:  Oletta Lamas.Akelia Husted RN   Comments:  This is patients 1st week report. She has done well her first week. She achieved apeak METS of 2.50. Her resting HR was 81 and Resting BP was 170/60, Her peak HR was 103 and her peak BP was 160/70. Patient always arrives late to rehab.A report will follow upon her 18th visit.

## 2013-03-15 NOTE — Addendum Note (Signed)
Encounter addended by: Norlene Duel, RN on: 03/15/2013  2:16 PM<BR>     Documentation filed: Notes Section

## 2013-03-15 NOTE — Addendum Note (Signed)
Encounter addended by: Norlene Duel, RN on: 03/15/2013  2:06 PM<BR>     Documentation filed: Clinical Notes

## 2013-03-15 NOTE — Addendum Note (Signed)
Encounter addended by: Norlene Duel, RN on: 03/15/2013  2:17 PM<BR>     Documentation filed: Clinical Notes

## 2013-03-15 NOTE — Addendum Note (Signed)
Encounter addended by: Norlene Duel, RN on: 03/15/2013  2:01 PM<BR>     Documentation filed: Notes Section

## 2013-03-15 NOTE — Progress Notes (Signed)
Cardiac Rehabilitation Program Outcomes Report   Orientation:  12/20/2012 Halfway report: 02/17/2013 Graduate Date:  tbd Discharge Date:  tbd # of sessions completed: 18 DX: NSTEMI Stent X1 and CAD  Cardiologist: Carlyle Dolly Family MD:  Ledell Peoples Class Time:  11:00  A.  Exercise Program:  Tolerates exercise @ 3.73 METS for 15 minutes  B.  Mental Health:  Good mental attitude  C.  Education/Instruction/Skills  Accurately checks own pulse.  Rest:  78  Exercise: 97, Knows THR for exercise and Uses Perceived Exertion Scale and/or Dyspnea Scale  Uses Perceived Exertion Scale and/or Dyspnea Scale  D.  Nutrition/Weight Control/Body Composition:  Adherence to prescribed nutrition program: good    E.  Blood Lipids    Lab Results  Component Value Date   CHOL 206* 11/07/2012   HDL 33* 11/07/2012   LDLCALC 132* 11/07/2012   TRIG 204* 11/07/2012   CHOLHDL 6.2 11/07/2012    F.  Lifestyle Changes:  Making positive lifestyle changes and Not smoking:  Quit Did not state when she stopped smoking  G.  Symptoms noted with exercise:  Asymptomatic  Report Completed By:  Oletta Lamas. Yurani Fettes RN   Comments:  This is patients halfway report. She achieved a peak METS of 3.73. Her resting HR was 78 and her resting Bp was 90/50, Her peak HR was 97 and her peak BP was 140/60. A graduation report will follow upon the 36th visit.

## 2013-03-17 ENCOUNTER — Encounter (HOSPITAL_COMMUNITY): Payer: Medicare Other

## 2013-03-20 ENCOUNTER — Encounter (HOSPITAL_COMMUNITY): Payer: Medicare Other

## 2013-03-22 ENCOUNTER — Encounter (HOSPITAL_COMMUNITY): Payer: Medicare Other

## 2013-03-24 ENCOUNTER — Encounter (HOSPITAL_COMMUNITY)
Admission: RE | Admit: 2013-03-24 | Discharge: 2013-03-24 | Disposition: A | Discharge status: Home / Self Care | Payer: Medicare Other | Source: Ambulatory Visit | Attending: Cardiology | Admitting: Cardiology

## 2013-03-27 ENCOUNTER — Encounter (HOSPITAL_COMMUNITY)
Admission: RE | Admit: 2013-03-27 | Discharge: 2013-03-27 | Disposition: A | Discharge status: Home / Self Care | Payer: Medicare Other | Source: Ambulatory Visit | Attending: Cardiology | Admitting: Cardiology

## 2013-03-27 DIAGNOSIS — Z9861 Coronary angioplasty status: Secondary | ICD-10-CM | POA: Insufficient documentation

## 2013-03-27 DIAGNOSIS — I2510 Atherosclerotic heart disease of native coronary artery without angina pectoris: Secondary | ICD-10-CM | POA: Insufficient documentation

## 2013-03-27 DIAGNOSIS — Z5189 Encounter for other specified aftercare: Secondary | ICD-10-CM | POA: Insufficient documentation

## 2013-03-27 DIAGNOSIS — I214 Non-ST elevation (NSTEMI) myocardial infarction: Secondary | ICD-10-CM | POA: Insufficient documentation

## 2013-03-29 ENCOUNTER — Encounter (HOSPITAL_COMMUNITY)
Admission: RE | Admit: 2013-03-29 | Discharge: 2013-03-29 | Disposition: A | Discharge status: Home / Self Care | Payer: Medicare Other | Source: Ambulatory Visit | Attending: Cardiology | Admitting: Cardiology

## 2013-03-29 DIAGNOSIS — I214 Non-ST elevation (NSTEMI) myocardial infarction: Secondary | ICD-10-CM | POA: Diagnosis not present

## 2013-03-29 DIAGNOSIS — I2510 Atherosclerotic heart disease of native coronary artery without angina pectoris: Secondary | ICD-10-CM | POA: Diagnosis not present

## 2013-03-29 DIAGNOSIS — Z5189 Encounter for other specified aftercare: Secondary | ICD-10-CM | POA: Diagnosis not present

## 2013-03-29 DIAGNOSIS — Z9861 Coronary angioplasty status: Secondary | ICD-10-CM | POA: Diagnosis not present

## 2013-03-31 ENCOUNTER — Encounter (HOSPITAL_COMMUNITY)
Admission: RE | Admit: 2013-03-31 | Discharge: 2013-03-31 | Disposition: A | Discharge status: Home / Self Care | Payer: Medicare Other | Source: Ambulatory Visit | Attending: Cardiology | Admitting: Cardiology

## 2013-03-31 DIAGNOSIS — Z9861 Coronary angioplasty status: Secondary | ICD-10-CM | POA: Diagnosis not present

## 2013-03-31 DIAGNOSIS — I214 Non-ST elevation (NSTEMI) myocardial infarction: Secondary | ICD-10-CM | POA: Diagnosis not present

## 2013-03-31 DIAGNOSIS — Z5189 Encounter for other specified aftercare: Secondary | ICD-10-CM | POA: Diagnosis not present

## 2013-03-31 DIAGNOSIS — I2510 Atherosclerotic heart disease of native coronary artery without angina pectoris: Secondary | ICD-10-CM | POA: Diagnosis not present

## 2013-04-03 ENCOUNTER — Encounter (HOSPITAL_COMMUNITY): Payer: Medicare Other

## 2013-04-05 ENCOUNTER — Encounter (HOSPITAL_COMMUNITY)
Admission: RE | Admit: 2013-04-05 | Discharge: 2013-04-05 | Disposition: A | Discharge status: Home / Self Care | Payer: Medicare Other | Source: Ambulatory Visit | Attending: Cardiology | Admitting: Cardiology

## 2013-04-05 DIAGNOSIS — I2510 Atherosclerotic heart disease of native coronary artery without angina pectoris: Secondary | ICD-10-CM | POA: Diagnosis not present

## 2013-04-05 DIAGNOSIS — Z5189 Encounter for other specified aftercare: Secondary | ICD-10-CM | POA: Diagnosis not present

## 2013-04-05 DIAGNOSIS — Z9861 Coronary angioplasty status: Secondary | ICD-10-CM | POA: Diagnosis not present

## 2013-04-05 DIAGNOSIS — I214 Non-ST elevation (NSTEMI) myocardial infarction: Secondary | ICD-10-CM | POA: Diagnosis not present

## 2013-04-07 ENCOUNTER — Encounter (HOSPITAL_COMMUNITY): Payer: Medicare Other

## 2013-04-10 ENCOUNTER — Encounter (HOSPITAL_COMMUNITY)
Admission: RE | Admit: 2013-04-10 | Discharge: 2013-04-10 | Disposition: A | Discharge status: Home / Self Care | Payer: Medicare Other | Source: Ambulatory Visit | Attending: Cardiology | Admitting: Cardiology

## 2013-04-10 DIAGNOSIS — Z5189 Encounter for other specified aftercare: Secondary | ICD-10-CM | POA: Diagnosis not present

## 2013-04-10 DIAGNOSIS — I214 Non-ST elevation (NSTEMI) myocardial infarction: Secondary | ICD-10-CM | POA: Diagnosis not present

## 2013-04-10 DIAGNOSIS — Z9861 Coronary angioplasty status: Secondary | ICD-10-CM | POA: Diagnosis not present

## 2013-04-10 DIAGNOSIS — I2510 Atherosclerotic heart disease of native coronary artery without angina pectoris: Secondary | ICD-10-CM | POA: Diagnosis not present

## 2013-04-12 ENCOUNTER — Encounter (HOSPITAL_COMMUNITY)
Admission: RE | Admit: 2013-04-12 | Discharge: 2013-04-12 | Disposition: A | Discharge status: Home / Self Care | Payer: Medicare Other | Source: Ambulatory Visit | Attending: Cardiology | Admitting: Cardiology

## 2013-04-12 DIAGNOSIS — Z9861 Coronary angioplasty status: Secondary | ICD-10-CM | POA: Diagnosis not present

## 2013-04-12 DIAGNOSIS — Z5189 Encounter for other specified aftercare: Secondary | ICD-10-CM | POA: Diagnosis not present

## 2013-04-12 DIAGNOSIS — I2510 Atherosclerotic heart disease of native coronary artery without angina pectoris: Secondary | ICD-10-CM | POA: Diagnosis not present

## 2013-04-12 DIAGNOSIS — I214 Non-ST elevation (NSTEMI) myocardial infarction: Secondary | ICD-10-CM | POA: Diagnosis not present

## 2013-04-14 ENCOUNTER — Telehealth: Payer: Self-pay | Admitting: *Deleted

## 2013-04-14 ENCOUNTER — Encounter (HOSPITAL_COMMUNITY)
Admission: RE | Admit: 2013-04-14 | Discharge: 2013-04-14 | Disposition: A | Discharge status: Home / Self Care | Payer: Medicare Other | Source: Ambulatory Visit | Attending: Cardiology | Admitting: Cardiology

## 2013-04-14 DIAGNOSIS — Z9861 Coronary angioplasty status: Secondary | ICD-10-CM | POA: Diagnosis not present

## 2013-04-14 DIAGNOSIS — Z5189 Encounter for other specified aftercare: Secondary | ICD-10-CM | POA: Diagnosis not present

## 2013-04-14 DIAGNOSIS — I214 Non-ST elevation (NSTEMI) myocardial infarction: Secondary | ICD-10-CM | POA: Diagnosis not present

## 2013-04-14 DIAGNOSIS — I2510 Atherosclerotic heart disease of native coronary artery without angina pectoris: Secondary | ICD-10-CM | POA: Diagnosis not present

## 2013-04-14 NOTE — Telephone Encounter (Signed)
Left message for pt to call back  °

## 2013-04-14 NOTE — Telephone Encounter (Signed)
Pt is on berlinta and she can not afford it. She wants to know if we can switch her to something else.

## 2013-04-17 NOTE — Telephone Encounter (Signed)
Did patient receive the drug assistance program information on brillinta?  Carlyle Dolly MD

## 2013-04-17 NOTE — Telephone Encounter (Signed)
Pt is to pick up samples of Brilinta on weds. Pt is also to pick up a pt assistance form .

## 2013-04-18 NOTE — Progress Notes (Signed)
Cardiac Rehabilitation Program Outcomes Report   Orientation:  12/20/2012 Graduate Date:  04/14/2013 Discharge Date:  04/14/2013 # of sessions completed: 36 DX: NSTEMI, Stent X 1 and CAD  Cardiologist: Carlyle Dolly Family MD:  Ledell Peoples Class Time:  11:00  A.  Exercise Program:  Tolerates exercise @ 3.73 METS for 15 minutes, Walk Test Results:  Post: Post Walk Test not performed patient could not stay due to work. Will try to come by later and do. and Discharged to home exercise program.  Anticipated compliance:  excellent  B.  Mental Health:  Good mental attitude  C.  Education/Instruction/Skills  Accurately checks own pulse.  Rest:  63  Exercise:  89, Knows THR for exercise, Uses Perceived Exertion Scale and/or Dyspnea Scale and Attended 12 education classes  Uses Perceived Exertion Scale and/or Dyspnea Scale  D.  Nutrition/Weight Control/Body Composition:  Adherence to prescribed nutrition program: good    E.  Blood Lipids    Lab Results  Component Value Date   CHOL 206* 11/07/2012   HDL 33* 11/07/2012   LDLCALC 132* 11/07/2012   TRIG 204* 11/07/2012   CHOLHDL 6.2 11/07/2012    F.  Lifestyle Changes:  Making positive lifestyle changes  G.  Symptoms noted with exercise:  Asymptomatic  Report Completed By:  Oletta Lamas. Williard Keller RN   Comments:  This is patients graduation report. She has done very well while in rehab. She achieved a peak METS of 3.73. Her resting HR was 63 and resting BP was 130/70 and peak HR was 89 and peak BP was 130/60. A call will be made to patient upon her 1 month, 6 month, and 1 year post graduation, to ensure compliance with exercise.

## 2013-04-19 DIAGNOSIS — E782 Mixed hyperlipidemia: Secondary | ICD-10-CM | POA: Diagnosis not present

## 2013-04-20 LAB — LIPID PANEL
Cholesterol: 150 mg/dL (ref 0–200)
HDL: 46 mg/dL (ref >39)
LDL CALC: 80 mg/dL (ref 0–99)
Total CHOL/HDL Ratio: 3.3 Ratio
Triglycerides: 118 mg/dL (ref <150)
VLDL: 24 mg/dL (ref 0–40)

## 2013-04-24 ENCOUNTER — Other Ambulatory Visit (HOSPITAL_COMMUNITY): Payer: Self-pay | Admitting: Nurse Practitioner

## 2013-06-14 ENCOUNTER — Other Ambulatory Visit (HOSPITAL_COMMUNITY): Payer: Self-pay | Admitting: Nurse Practitioner

## 2013-07-05 ENCOUNTER — Other Ambulatory Visit (HOSPITAL_COMMUNITY): Payer: Self-pay | Admitting: Nurse Practitioner

## 2013-07-22 ENCOUNTER — Other Ambulatory Visit (HOSPITAL_COMMUNITY): Payer: Self-pay | Admitting: Nurse Practitioner

## 2013-09-11 ENCOUNTER — Ambulatory Visit (INDEPENDENT_AMBULATORY_CARE_PROVIDER_SITE_OTHER): Payer: Medicare Other | Admitting: Cardiology

## 2013-09-11 ENCOUNTER — Encounter: Payer: Self-pay | Admitting: Cardiology

## 2013-09-11 VITALS — BP 124/60 | HR 72 | Ht 70.0 in | Wt 156.0 lb

## 2013-09-11 DIAGNOSIS — E119 Type 2 diabetes mellitus without complications: Secondary | ICD-10-CM | POA: Diagnosis not present

## 2013-09-11 DIAGNOSIS — E785 Hyperlipidemia, unspecified: Secondary | ICD-10-CM | POA: Diagnosis not present

## 2013-09-11 DIAGNOSIS — I2510 Atherosclerotic heart disease of native coronary artery without angina pectoris: Secondary | ICD-10-CM

## 2013-09-11 MED ORDER — TICAGRELOR 90 MG PO TABS: 90.0000 mg | ORAL_TABLET | Freq: Two times a day (BID) | ORAL | Status: DC

## 2013-09-11 MED ORDER — NITROGLYCERIN 0.4 MG SL SUBL: 0.4000 mg | SUBLINGUAL_TABLET | SUBLINGUAL | Status: DC | PRN

## 2013-09-11 NOTE — Patient Instructions (Signed)
Your physician recommends that you schedule a follow-up appointment in: 6 months with Dr. Harl Bowie  Your physician recommends that you return for lab work for HgbA1c   Your physician has recommended you make the following change in your medication:   Discontinue Brilinta after October 12th   We have given you a list of primary care physicians. Please make an appointment.  Thank you for choosing [facility] [place]!!

## 2013-09-11 NOTE — Progress Notes (Signed)
Clinical Summary Ms. Strawderman is a 68 y.o.female seen today for follow up of the following medical problems.  1. CAD  - patient admitted to [facility] 10/12-10/14/14 with anterior wall STEMI, LVEF by LV gram 45% with anterior wall hypokinesis. LVEF 55% by echo with apical hypokinesis  - s/p DES to mid LAD  - no chest pain recently - compliant with meds: atorva, enalapril, metoprolol, ticagrelor, ASA   2. HL  - just initiated on high dose statin during admission  Panel 03/2013: TC 150 TG 118 HDL 46 LDL 70   3. DM  - newly diagnosed with Hgb A1c of 7.3 during hospital admission  - was to establish outpatient care, has appt October 27 with PCP.   4. Tobacco abuse  - has not smoked since her MI       Past Medical History  Diagnosis Date  . CAD (coronary artery disease)     a. 10/2012 Ant STEMI/PCI: LM 20-30d, LAD 50/76m (2.5x24 Promus Premier DES), D1 50ost, LCX 50ost, OM1 50/50, RCa 24m/d, 50-70d @ bifurcation of PDA/PLA, EF 45%.  . Tobacco abuse   . Hyperlipidemia   . Myocardial infarction   . Type II diabetes mellitus, uncontrolled 11/08/2012     Allergies  Allergen Reactions  . Cheese Hives    On chin  . Aspirin Rash     Current Outpatient Prescriptions  Medication Sig Dispense Refill  . acetaminophen (TYLENOL) 500 MG tablet Take 1,000 mg by mouth daily as needed for pain.      . Ascorbic Acid (VITAMIN C PO) Take 1 tablet by mouth daily.      Marland Kitchen aspirin EC 81 MG tablet Take 81 mg by mouth daily.      Marland Kitchen atorvastatin (LIPITOR) 80 MG tablet Take 1 tablet (80 mg total) by mouth daily at 6 PM.  30 tablet  6  . diphenhydrAMINE (BENADRYL) 25 MG tablet Take 50 mg by mouth daily as needed (congestion).      . enalapril (VASOTEC) 5 MG tablet TAKE 1 TABLET BY MOUTH TWICE A DAY  60 tablet  5  . metFORMIN (GLUCOPHAGE) 500 MG tablet Take 1 tablet (500 mg total) by mouth daily with breakfast.  30 tablet  3  . metoprolol tartrate (LOPRESSOR) 25 MG tablet TAKE 1 TABLET BY MOUTH  TWICE A DAY  60 tablet  5  . Multiple Vitamin (MULTIVITAMIN WITH MINERALS) TABS tablet Take 1 tablet by mouth daily.      . nitroGLYCERIN (NITROSTAT) 0.4 MG SL tablet Place 1 tablet (0.4 mg total) under the tongue every 5 (five) minutes x 3 doses as needed for chest pain.  25 tablet  3  . pantoprazole (PROTONIX) 40 MG tablet TAKE 1 TABLET BY MOUTH EVERY DAY  30 tablet  6  . POTASSIUM PO Take 1 tablet by mouth daily.      . Ticagrelor (BRILINTA) 90 MG TABS tablet Take 1 tablet (90 mg total) by mouth 2 (two) times daily.  60 tablet  6  . traMADol (ULTRAM) 50 MG tablet Take 1 tablet (50 mg total) by mouth every 8 (eight) hours as needed for pain.  30 tablet  0   No current facility-administered medications for this visit.     Past Surgical History  Procedure Laterality Date  . Laparoscopy with tubal ligation    . Tubal ligation    . Coronary stent placement       Allergies  Allergen Reactions  . Cheese  Hives    On chin  . Aspirin Rash      No family history on file.   Social History Ms. Lebeck reports that she quit smoking about 10 months ago. Her smoking use included Cigarettes. She has a 50 pack-year smoking history. She has never used smokeless tobacco. Ms. Sutherland reports that she drinks alcohol.   Review of Systems CONSTITUTIONAL: No weight loss, fever, chills, weakness or fatigue.  HEENT: Eyes: No visual loss, blurred vision, double vision or yellow sclerae.No hearing loss, sneezing, congestion, runny nose or sore throat.  SKIN: No rash or itching.  CARDIOVASCULAR: per HPI RESPIRATORY: No shortness of breath, cough or sputum.  GASTROINTESTINAL: No anorexia, nausea, vomiting or diarrhea. No abdominal pain or blood.  GENITOURINARY: No burning on urination, no polyuria NEUROLOGICAL: No headache, dizziness, syncope, paralysis, ataxia, numbness or tingling in the extremities. No change in bowel or bladder control.  MUSCULOSKELETAL: No muscle, back pain, joint pain or  stiffness.  LYMPHATICS: No enlarged nodes. No history of splenectomy.  PSYCHIATRIC: No history of depression or anxiety.  ENDOCRINOLOGIC: No reports of sweating, cold or heat intolerance. No polyuria or polydipsia.  Marland Kitchen   Physical Examination p 72 bp 124/60 Wt 156 lbs BMI 22 Gen: resting comfortably, no acute distress HEENT: no scleral icterus, pupils equal round and reactive, no palptable cervical adenopathy,  CV: RRR, no m/r/g, no JVD, no carotid bruits Resp: Clear to auscultation bilaterally GI: abdomen is soft, non-tender, non-distended, normal bowel sounds, no hepatosplenomegaly MSK: extremities are warm, no edema.  Skin: warm, no rash Neuro:  no focal deficits Psych: appropriate affect   Diagnostic Studies  11/06/12 Cath  Cardiac Catheterization and Percutaneous Coronary Intervention 10.12.2014  Hemodynamics:  AO 175/88  LV 177/16  Coronary angiography:  Coronary dominance: right  Left mainstem: Patent vessel with 20-30% distal LM stenosis  Left anterior descending (LAD): Proximal irregularity. There is severe disease in the mid-LAD. Diffuse 50% stenosis leading into a 99% mid-vessel stenosis. The first diagonal is large with 50% ostial stenosis and it originates at the proximal aspect of LAD plaque. The distal LAD is tortuous and widely patent as it wraps the LV apex.  **The mid-LAD was successfully stented using a 2.5x24 mm Promus Premier drug-eluting stent.**  Left circumflex (LCx): 50% ostial stenosis, 50% stenosis of OM1 with sequential lesions  Right coronary artery (RCA): normal caliber, dominant vessel. There is diffuse disease noted. There is 50% mid-vessel stenosis and 50-70% bifurcational disease at the PDA/PLA origin.  Left ventriculography: Left ventricular systolic function is moderately depressed. There is hypokinesis of the distal anterior wall and anteroapex. The LVEF is estimated at 45%.  Final Conclusions:  1. Severe mid-LAD stenosis treated successfully  with primary PCI using a DES  2. Moderate RCA and LCx stenosis  3. Moderate LV dysfunction  11/07/12 Echo: LVEF 55%, mild LVH, diastolic dysfunction without grade specified, hypokinesis apical anteroseptal and apical inferoseptal segments.  11/17/12 Clinic EKG  SR, normal axis, anteroseptal Q waves, diffuse T wave inversions       Assessment and Plan  1. CAD  - recent STEMI s/p DES to LAD, DAPT at least until 10/2013.  - no significant current symptoms  - continue current medical therapy   2. HL  - just initiated on statin therapy during admission 10/2012  - continue high dose statin  3. DM  - newly diagnosed, initiated on metformin at hospital discharge  - she has established with a PCP for further managment   4.  Tobacco abuse  - patient has successfully quit       F/u 6 months   Arnoldo Lenis, M.D., F.A.C.C.

## 2013-11-22 ENCOUNTER — Other Ambulatory Visit (HOSPITAL_COMMUNITY): Payer: Self-pay | Admitting: Nurse Practitioner

## 2013-12-19 ENCOUNTER — Telehealth: Payer: Self-pay | Admitting: *Deleted

## 2013-12-19 NOTE — Telephone Encounter (Signed)
PT states that she has a pain in her left leg, no other symptoms. Does not have PCP

## 2013-12-19 NOTE — Telephone Encounter (Signed)
Pt was given a Dr. In [place] due to pt lives in [place] and transportation is easier. To be established with for her leg pain. Pt had no other symptoms. Gave pt phone number to PCP in [place]. Pt will see Dr. Harl Bowie again in February.

## 2014-01-04 ENCOUNTER — Encounter (HOSPITAL_COMMUNITY): Payer: Self-pay | Admitting: Cardiovascular Disease

## 2014-03-01 ENCOUNTER — Other Ambulatory Visit: Payer: Self-pay | Admitting: Cardiology

## 2014-03-01 NOTE — Telephone Encounter (Signed)
Faxed to [facility] refill per Dr Harl Bowie

## 2014-03-01 NOTE — Telephone Encounter (Signed)
Please see refill bin / tgs  °

## 2014-04-25 ENCOUNTER — Ambulatory Visit (INDEPENDENT_AMBULATORY_CARE_PROVIDER_SITE_OTHER): Payer: Medicare Other | Admitting: Cardiology

## 2014-04-25 ENCOUNTER — Other Ambulatory Visit: Payer: Self-pay | Admitting: Cardiology

## 2014-04-25 ENCOUNTER — Ambulatory Visit: Payer: Medicare Other | Admitting: Cardiology

## 2014-04-25 VITALS — BP 136/62 | HR 70 | Ht 70.0 in | Wt 167.0 lb

## 2014-04-25 DIAGNOSIS — I2510 Atherosclerotic heart disease of native coronary artery without angina pectoris: Secondary | ICD-10-CM | POA: Diagnosis not present

## 2014-04-25 DIAGNOSIS — E782 Mixed hyperlipidemia: Secondary | ICD-10-CM | POA: Diagnosis not present

## 2014-04-25 DIAGNOSIS — E109 Type 1 diabetes mellitus without complications: Secondary | ICD-10-CM | POA: Diagnosis not present

## 2014-04-25 NOTE — Patient Instructions (Signed)

## 2014-04-25 NOTE — Progress Notes (Signed)
Clinical Summary Ms. Tapia is a 69 y.o.female seen today for follow up of the following medical problems.   1. CAD  - patient admitted to [facility] 10/12-10/14/14 with anterior wall STEMI, LVEF by LV gram 45% with anterior wall hypokinesis. LVEF 55% by echo with apical hypokinesis - s/p DES to mid LAD   - no chest pain recently. No SOB or DOE - compliant with meds  2. HL  - compliant with statin Panel 03/2013: TC 150 TG 118 HDL 46 LDL 70   3. DM2  - newly diagnosed with Hgb A1c of 7.3 during hospital admission  -followed by pcp  Past Medical History  Diagnosis Date  . CAD (coronary artery disease)     a. 10/2012 Ant STEMI/PCI: LM 20-30d, LAD 50/61m (2.5x24 Promus Premier DES), D1 50ost, LCX 50ost, OM1 50/50, RCa 42m/d, 50-70d @ bifurcation of PDA/PLA, EF 45%.  . Tobacco abuse   . Hyperlipidemia   . Myocardial infarction   . Type II diabetes mellitus, uncontrolled 11/08/2012     Allergies  Allergen Reactions  . Cheese Hives    On chin  . Aspirin Rash     Current Outpatient Prescriptions  Medication Sig Dispense Refill  . acetaminophen (TYLENOL) 500 MG tablet Take 1,000 mg by mouth daily as needed for pain.    . Ascorbic Acid (VITAMIN C PO) Take 1 tablet by mouth daily.    Marland Kitchen aspirin EC 81 MG tablet Take 81 mg by mouth daily.    Marland Kitchen atorvastatin (LIPITOR) 80 MG tablet TAKE 1 TABLET (80 MG TOTAL) BY MOUTH DAILY AT 6 PM. 30 tablet 5  . diphenhydrAMINE (BENADRYL) 25 MG tablet Take 50 mg by mouth daily as needed (congestion).    . enalapril (VASOTEC) 5 MG tablet TAKE 1 TABLET BY MOUTH TWICE A DAY 60 tablet 5  . metFORMIN (GLUCOPHAGE) 500 MG tablet Take 1 tablet (500 mg total) by mouth daily with breakfast. 30 tablet 3  . metoprolol tartrate (LOPRESSOR) 25 MG tablet TAKE 1 TABLET BY MOUTH TWICE A DAY 60 tablet 5  . Multiple Vitamin (MULTIVITAMIN WITH MINERALS) TABS tablet Take 1 tablet by mouth daily.    . nitroGLYCERIN (NITROSTAT) 0.4 MG SL tablet Place 1 tablet  (0.4 mg total) under the tongue every 5 (five) minutes x 3 doses as needed for chest pain. 25 tablet 3  . pantoprazole (PROTONIX) 40 MG tablet TAKE 1 TABLET BY MOUTH EVERY DAY 30 tablet 6  . POTASSIUM PO Take 1 tablet by mouth daily. 99 mg daily    . ticagrelor (BRILINTA) 90 MG TABS tablet Take 1 tablet (90 mg total) by mouth 2 (two) times daily. 60 tablet 6   No current facility-administered medications for this visit.     Past Surgical History  Procedure Laterality Date  . Laparoscopy with tubal ligation    . Tubal ligation    . Coronary stent placement    . Left heart catheterization with coronary angiogram N/A 11/06/2012    Procedure: LEFT HEART CATHETERIZATION WITH CORONARY ANGIOGRAM;  Surgeon: Blane Ohara, MD;  Location: [facility] CATH LAB;  Service: Cardiovascular;  Laterality: N/A;     Allergies  Allergen Reactions  . Cheese Hives    On chin  . Aspirin Rash      No family history on file.   Social History Ms. Twiford reports that she quit smoking about 17 months ago. Her smoking use included Cigarettes. She has a 50 pack-year smoking history. She  has never used smokeless tobacco. Ms. Najera reports that she drinks alcohol.   Review of Systems CONSTITUTIONAL: No weight loss, fever, chills, weakness or fatigue.  HEENT: Eyes: No visual loss, blurred vision, double vision or yellow sclerae.No hearing loss, sneezing, congestion, runny nose or sore throat.  SKIN: No rash or itching.  CARDIOVASCULAR: per HPI RESPIRATORY: No shortness of breath, cough or sputum.  GASTROINTESTINAL: No anorexia, nausea, vomiting or diarrhea. No abdominal pain or blood.  GENITOURINARY: No burning on urination, no polyuria NEUROLOGICAL: No headache, dizziness, syncope, paralysis, ataxia, numbness or tingling in the extremities. No change in bowel or bladder control.  MUSCULOSKELETAL: No muscle, back pain, joint pain or stiffness.  LYMPHATICS: No enlarged nodes. No history of splenectomy.    PSYCHIATRIC: No history of depression or anxiety.  ENDOCRINOLOGIC: No reports of sweating, cold or heat intolerance. No polyuria or polydipsia.  Marland Kitchen   Physical Examination p 70 bp 136/62 Wt 167 lbs BMI 24 Gen: resting comfortably, no acute distress HEENT: no scleral icterus, pupils equal round and reactive, no palptable cervical adenopathy,  CV: RRR, no m/r/g, no JVD, no carotid bruits Resp: Clear to auscultation bilaterally GI: abdomen is soft, non-tender, non-distended, normal bowel sounds, no hepatosplenomegaly MSK: extremities are warm, no edema.  Skin: warm, no rash Neuro:  no focal deficits Psych: appropriate affect   Diagnostic Studies  11/06/12 Cath  Cardiac Catheterization and Percutaneous Coronary Intervention 10.12.2014  Hemodynamics:  AO 175/88  LV 177/16  Coronary angiography:  Coronary dominance: right  Left mainstem: Patent vessel with 20-30% distal LM stenosis  Left anterior descending (LAD): Proximal irregularity. There is severe disease in the mid-LAD. Diffuse 50% stenosis leading into a 99% mid-vessel stenosis. The first diagonal is large with 50% ostial stenosis and it originates at the proximal aspect of LAD plaque. The distal LAD is tortuous and widely patent as it wraps the LV apex.  **The mid-LAD was successfully stented using a 2.5x24 mm Promus Premier drug-eluting stent.**  Left circumflex (LCx): 50% ostial stenosis, 50% stenosis of OM1 with sequential lesions  Right coronary artery (RCA): normal caliber, dominant vessel. There is diffuse disease noted. There is 50% mid-vessel stenosis and 50-70% bifurcational disease at the PDA/PLA origin.  Left ventriculography: Left ventricular systolic function is moderately depressed. There is hypokinesis of the distal anterior wall and anteroapex. The LVEF is estimated at 45%.  Final Conclusions:  1. Severe mid-LAD stenosis treated successfully with primary PCI using a DES  2. Moderate RCA and LCx  stenosis  3. Moderate LV dysfunction  11/07/12 Echo: LVEF 55%, mild LVH, diastolic dysfunction without grade specified, hypokinesis apical anteroseptal and apical inferoseptal segments.  11/17/12 Clinic EKG  SR, normal axis, anteroseptal Q waves, diffuse T wave inversions     Assessment and Plan    1. CAD  - no significant current symptoms  - continue current medical therapy   2. HL  - continue high dose statin - she had labs drawn today, f/u results.   3. DM2 - followed by pcp   F/u 6 months    Arnoldo Lenis, M.D.

## 2014-04-26 ENCOUNTER — Encounter: Payer: Self-pay | Admitting: Cardiology

## 2014-04-26 LAB — HEMOGLOBIN A1C
Hgb A1c MFr Bld: 7.1 % — ABNORMAL HIGH (ref <5.7)
Mean Plasma Glucose: 157 mg/dL — ABNORMAL HIGH (ref <117)

## 2014-06-05 ENCOUNTER — Ambulatory Visit: Payer: Medicare Other | Admitting: Cardiology

## 2014-06-13 ENCOUNTER — Other Ambulatory Visit: Payer: Self-pay | Admitting: Cardiology

## 2014-06-13 MED ORDER — METOPROLOL TARTRATE 25 MG PO TABS: 25.0000 mg | ORAL_TABLET | Freq: Two times a day (BID) | ORAL | Status: DC

## 2014-06-13 MED ORDER — ENALAPRIL MALEATE 5 MG PO TABS: 5.0000 mg | ORAL_TABLET | Freq: Two times a day (BID) | ORAL | Status: DC

## 2014-06-13 NOTE — Telephone Encounter (Signed)
Refills complete 

## 2014-06-13 NOTE — Telephone Encounter (Signed)
Needs refills on Enalapril and Metoprolol sent to [facility] or [facility] [address] / tg

## 2014-07-05 ENCOUNTER — Other Ambulatory Visit: Payer: Self-pay | Admitting: Cardiology

## 2014-07-11 ENCOUNTER — Telehealth: Payer: Self-pay | Admitting: *Deleted

## 2014-07-11 NOTE — Telephone Encounter (Signed)
Pt aware, routed.

## 2014-07-11 NOTE — Telephone Encounter (Signed)
-----   Message from Arnoldo Lenis, MD sent at 07/09/2014  2:41 PM EDT ----- Please forward to pcp. Her diabetes control is better but just slightly misses being at goal  Zandra Abts MD

## 2014-08-04 ENCOUNTER — Emergency Department (HOSPITAL_COMMUNITY): Payer: Medicare Other

## 2014-08-04 ENCOUNTER — Inpatient Hospital Stay (HOSPITAL_COMMUNITY): Payer: Medicare Other

## 2014-08-04 ENCOUNTER — Inpatient Hospital Stay (HOSPITAL_COMMUNITY)
Admission: EM | Admit: 2014-08-04 | Discharge: 2014-08-09 | DRG: 418 | Disposition: A | Discharge status: Home / Self Care | Payer: Medicare Other | Attending: Internal Medicine | Admitting: Internal Medicine

## 2014-08-04 ENCOUNTER — Encounter (HOSPITAL_COMMUNITY): Payer: Self-pay | Admitting: *Deleted

## 2014-08-04 DIAGNOSIS — R7989 Other specified abnormal findings of blood chemistry: Secondary | ICD-10-CM | POA: Diagnosis not present

## 2014-08-04 DIAGNOSIS — K8010 Calculus of gallbladder with chronic cholecystitis without obstruction: Secondary | ICD-10-CM | POA: Diagnosis not present

## 2014-08-04 DIAGNOSIS — IMO0002 Reserved for concepts with insufficient information to code with codable children: Secondary | ICD-10-CM | POA: Diagnosis present

## 2014-08-04 DIAGNOSIS — I2510 Atherosclerotic heart disease of native coronary artery without angina pectoris: Secondary | ICD-10-CM | POA: Diagnosis present

## 2014-08-04 DIAGNOSIS — R1084 Generalized abdominal pain: Secondary | ICD-10-CM | POA: Diagnosis not present

## 2014-08-04 DIAGNOSIS — R1110 Vomiting, unspecified: Secondary | ICD-10-CM | POA: Diagnosis not present

## 2014-08-04 DIAGNOSIS — Z01810 Encounter for preprocedural cardiovascular examination: Secondary | ICD-10-CM | POA: Diagnosis not present

## 2014-08-04 DIAGNOSIS — E1165 Type 2 diabetes mellitus with hyperglycemia: Secondary | ICD-10-CM | POA: Diagnosis present

## 2014-08-04 DIAGNOSIS — Z955 Presence of coronary angioplasty implant and graft: Secondary | ICD-10-CM | POA: Diagnosis not present

## 2014-08-04 DIAGNOSIS — R1011 Right upper quadrant pain: Secondary | ICD-10-CM | POA: Diagnosis not present

## 2014-08-04 DIAGNOSIS — K8020 Calculus of gallbladder without cholecystitis without obstruction: Secondary | ICD-10-CM | POA: Diagnosis not present

## 2014-08-04 DIAGNOSIS — R112 Nausea with vomiting, unspecified: Secondary | ICD-10-CM | POA: Diagnosis not present

## 2014-08-04 DIAGNOSIS — Z833 Family history of diabetes mellitus: Secondary | ICD-10-CM | POA: Diagnosis not present

## 2014-08-04 DIAGNOSIS — I252 Old myocardial infarction: Secondary | ICD-10-CM | POA: Diagnosis not present

## 2014-08-04 DIAGNOSIS — E871 Hypo-osmolality and hyponatremia: Secondary | ICD-10-CM | POA: Diagnosis not present

## 2014-08-04 DIAGNOSIS — K8000 Calculus of gallbladder with acute cholecystitis without obstruction: Secondary | ICD-10-CM | POA: Diagnosis not present

## 2014-08-04 DIAGNOSIS — R748 Abnormal levels of other serum enzymes: Secondary | ICD-10-CM | POA: Diagnosis not present

## 2014-08-04 DIAGNOSIS — K821 Hydrops of gallbladder: Secondary | ICD-10-CM | POA: Diagnosis not present

## 2014-08-04 DIAGNOSIS — D259 Leiomyoma of uterus, unspecified: Secondary | ICD-10-CM | POA: Diagnosis not present

## 2014-08-04 DIAGNOSIS — R9431 Abnormal electrocardiogram [ECG] [EKG]: Secondary | ICD-10-CM

## 2014-08-04 DIAGNOSIS — I4581 Long QT syndrome: Secondary | ICD-10-CM | POA: Diagnosis not present

## 2014-08-04 DIAGNOSIS — E785 Hyperlipidemia, unspecified: Secondary | ICD-10-CM | POA: Diagnosis present

## 2014-08-04 DIAGNOSIS — Z87891 Personal history of nicotine dependence: Secondary | ICD-10-CM | POA: Diagnosis not present

## 2014-08-04 DIAGNOSIS — K8050 Calculus of bile duct without cholangitis or cholecystitis without obstruction: Secondary | ICD-10-CM

## 2014-08-04 DIAGNOSIS — R778 Other specified abnormalities of plasma proteins: Secondary | ICD-10-CM | POA: Insufficient documentation

## 2014-08-04 DIAGNOSIS — I248 Other forms of acute ischemic heart disease: Secondary | ICD-10-CM | POA: Diagnosis not present

## 2014-08-04 DIAGNOSIS — K828 Other specified diseases of gallbladder: Secondary | ICD-10-CM | POA: Diagnosis not present

## 2014-08-04 DIAGNOSIS — R109 Unspecified abdominal pain: Secondary | ICD-10-CM | POA: Diagnosis present

## 2014-08-04 HISTORY — DX: Type 2 diabetes mellitus without complications: E11.9

## 2014-08-04 LAB — URINE MICROSCOPIC-ADD ON

## 2014-08-04 LAB — COMPREHENSIVE METABOLIC PANEL
ALK PHOS: 98 U/L (ref 38–126)
ALT: 25 U/L (ref 14–54)
AST: 25 U/L (ref 15–41)
Albumin: 4.4 g/dL (ref 3.5–5.0)
Anion gap: 10 (ref 5–15)
BILIRUBIN TOTAL: 0.9 mg/dL (ref 0.3–1.2)
BUN: 10 mg/dL (ref 6–20)
CHLORIDE: 97 mmol/L — AB (ref 101–111)
CO2: 27 mmol/L (ref 22–32)
CREATININE: 0.66 mg/dL (ref 0.44–1.00)
Calcium: 8.7 mg/dL — ABNORMAL LOW (ref 8.9–10.3)
GFR calc Af Amer: >60 mL/min (ref >60)
GFR calc non Af Amer: >60 mL/min (ref >60)
Glucose, Bld: 186 mg/dL — ABNORMAL HIGH (ref 65–99)
Potassium: 3.8 mmol/L (ref 3.5–5.1)
Sodium: 134 mmol/L — ABNORMAL LOW (ref 135–145)
Total Protein: 8 g/dL (ref 6.5–8.1)

## 2014-08-04 LAB — LIPASE, BLOOD: Lipase: 38 U/L (ref 22–51)

## 2014-08-04 LAB — TROPONIN I
Troponin I: 0.08 ng/mL — ABNORMAL HIGH (ref <0.031)
Troponin I: 0.17 ng/mL — ABNORMAL HIGH (ref <0.031)

## 2014-08-04 LAB — CBC WITH DIFFERENTIAL/PLATELET
BASOS PCT: 0 % (ref 0–1)
Basophils Absolute: 0 10*3/uL (ref 0.0–0.1)
Eosinophils Absolute: 0 10*3/uL (ref 0.0–0.7)
Eosinophils Relative: 0 % (ref 0–5)
HCT: 40.9 % (ref 36.0–46.0)
Hemoglobin: 13.4 g/dL (ref 12.0–15.0)
LYMPHS ABS: 1.2 10*3/uL (ref 0.7–4.0)
Lymphocytes Relative: 13 % (ref 12–46)
MCH: 27.8 pg (ref 26.0–34.0)
MCHC: 32.8 g/dL (ref 30.0–36.0)
MCV: 84.9 fL (ref 78.0–100.0)
MONOS PCT: 4 % (ref 3–12)
Monocytes Absolute: 0.3 10*3/uL (ref 0.1–1.0)
NEUTROS PCT: 83 % — AB (ref 43–77)
Neutro Abs: 7.5 10*3/uL (ref 1.7–7.7)
PLATELETS: 235 10*3/uL (ref 150–400)
RBC: 4.82 MIL/uL (ref 3.87–5.11)
RDW: 14.9 % (ref 11.5–15.5)
WBC: 9 10*3/uL (ref 4.0–10.5)

## 2014-08-04 LAB — URINALYSIS, ROUTINE W REFLEX MICROSCOPIC
Bilirubin Urine: NEGATIVE
GLUCOSE, UA: NEGATIVE mg/dL
Ketones, ur: NEGATIVE mg/dL
Leukocytes, UA: NEGATIVE
Nitrite: POSITIVE — AB
PROTEIN: 30 mg/dL — AB
Specific Gravity, Urine: 1.025 (ref 1.005–1.030)
Urobilinogen, UA: 0.2 mg/dL (ref 0.0–1.0)
pH: 6.5 (ref 5.0–8.0)

## 2014-08-04 MED ORDER — HYDRALAZINE HCL 20 MG/ML IJ SOLN: 10.0000 mg | Freq: Four times a day (QID) | INTRAMUSCULAR | Status: DC | PRN

## 2014-08-04 MED ORDER — INSULIN ASPART 100 UNIT/ML ~~LOC~~ SOLN
0.0000 [IU] | Freq: Three times a day (TID) | SUBCUTANEOUS | Status: DC
  Administered 2014-08-05 (×3): 1 [IU] via SUBCUTANEOUS
  Administered 2014-08-07: 3 [IU] via SUBCUTANEOUS
  Administered 2014-08-08 – 2014-08-09 (×2): 1 [IU] via SUBCUTANEOUS

## 2014-08-04 MED ORDER — HYDROMORPHONE HCL 1 MG/ML IJ SOLN
1.0000 mg | Freq: Once | INTRAMUSCULAR | Status: AC
  Administered 2014-08-04: 1 mg via INTRAVENOUS
  Filled 2014-08-04: qty 1

## 2014-08-04 MED ORDER — SODIUM CHLORIDE 0.9 % IV SOLN: INTRAVENOUS | Status: DC

## 2014-08-04 MED ORDER — ONDANSETRON HCL 4 MG/2ML IJ SOLN: 4.0000 mg | Freq: Four times a day (QID) | INTRAMUSCULAR | Status: DC | PRN

## 2014-08-04 MED ORDER — NITROGLYCERIN 2 % TD OINT
0.5000 [in_us] | TOPICAL_OINTMENT | Freq: Three times a day (TID) | TRANSDERMAL | Status: DC
  Administered 2014-08-04 – 2014-08-06 (×4): 0.5 [in_us] via TOPICAL
  Filled 2014-08-04 (×6): qty 1

## 2014-08-04 MED ORDER — ATORVASTATIN CALCIUM 40 MG PO TABS
80.0000 mg | ORAL_TABLET | Freq: Every day | ORAL | Status: DC
  Administered 2014-08-05 – 2014-08-08 (×4): 80 mg via ORAL
  Filled 2014-08-04 (×4): qty 2

## 2014-08-04 MED ORDER — PIPERACILLIN-TAZOBACTAM 3.375 G IVPB
3.3750 g | Freq: Once | INTRAVENOUS | Status: AC
  Administered 2014-08-04: 3.375 g via INTRAVENOUS
  Filled 2014-08-04: qty 50

## 2014-08-04 MED ORDER — IOHEXOL 300 MG/ML  SOLN
100.0000 mL | Freq: Once | INTRAMUSCULAR | Status: AC | PRN
  Administered 2014-08-04: 100 mL via INTRAVENOUS

## 2014-08-04 MED ORDER — ONDANSETRON HCL 4 MG/2ML IJ SOLN
4.0000 mg | Freq: Once | INTRAMUSCULAR | Status: AC
  Administered 2014-08-04: 4 mg via INTRAVENOUS
  Filled 2014-08-04: qty 2

## 2014-08-04 MED ORDER — IOHEXOL 300 MG/ML  SOLN
25.0000 mL | Freq: Once | INTRAMUSCULAR | Status: AC | PRN
  Administered 2014-08-04: 25 mL via ORAL

## 2014-08-04 MED ORDER — PANTOPRAZOLE SODIUM 40 MG IV SOLR
40.0000 mg | Freq: Two times a day (BID) | INTRAVENOUS | Status: DC
  Administered 2014-08-04 – 2014-08-07 (×6): 40 mg via INTRAVENOUS
  Filled 2014-08-04 (×6): qty 40

## 2014-08-04 MED ORDER — ENALAPRIL MALEATE 5 MG PO TABS
5.0000 mg | ORAL_TABLET | Freq: Two times a day (BID) | ORAL | Status: DC
  Administered 2014-08-04 – 2014-08-09 (×8): 5 mg via ORAL
  Filled 2014-08-04 (×9): qty 1

## 2014-08-04 MED ORDER — ASPIRIN EC 81 MG PO TBEC
81.0000 mg | DELAYED_RELEASE_TABLET | Freq: Every day | ORAL | Status: DC
  Administered 2014-08-04 – 2014-08-06 (×3): 81 mg via ORAL
  Filled 2014-08-04 (×3): qty 1

## 2014-08-04 MED ORDER — METOPROLOL TARTRATE 25 MG PO TABS
25.0000 mg | ORAL_TABLET | Freq: Two times a day (BID) | ORAL | Status: DC
  Administered 2014-08-04 – 2014-08-09 (×10): 25 mg via ORAL
  Filled 2014-08-04 (×10): qty 1

## 2014-08-04 MED ORDER — MORPHINE SULFATE 4 MG/ML IJ SOLN
4.0000 mg | Freq: Once | INTRAMUSCULAR | Status: AC
  Administered 2014-08-04: 4 mg via INTRAVENOUS
  Filled 2014-08-04: qty 1

## 2014-08-04 MED ORDER — SODIUM CHLORIDE 0.9 % IV SOLN
INTRAVENOUS | Status: DC
Start: 2014-08-04 — End: 2014-08-05

## 2014-08-04 MED ORDER — ENOXAPARIN SODIUM 40 MG/0.4ML ~~LOC~~ SOLN
40.0000 mg | SUBCUTANEOUS | Status: DC
  Administered 2014-08-04: 40 mg via SUBCUTANEOUS
  Filled 2014-08-04: qty 0.4

## 2014-08-04 MED ORDER — SODIUM CHLORIDE 0.9 % IV BOLUS (SEPSIS)
1000.0000 mL | Freq: Once | INTRAVENOUS | Status: AC
  Administered 2014-08-04: 1000 mL via INTRAVENOUS

## 2014-08-04 MED ORDER — SODIUM CHLORIDE 0.9 % IJ SOLN
3.0000 mL | Freq: Two times a day (BID) | INTRAMUSCULAR | Status: DC
  Administered 2014-08-05 – 2014-08-06 (×3): 3 mL via INTRAVENOUS
  Administered 2014-08-07: 10 mL via INTRAVENOUS
  Administered 2014-08-07 – 2014-08-08 (×2): 3 mL via INTRAVENOUS

## 2014-08-04 MED ORDER — MORPHINE SULFATE 2 MG/ML IJ SOLN
1.0000 mg | INTRAMUSCULAR | Status: DC | PRN
  Administered 2014-08-04: 1 mg via INTRAVENOUS
  Filled 2014-08-04: qty 1

## 2014-08-04 MED ORDER — NITROGLYCERIN 0.4 MG SL SUBL: 0.4000 mg | SUBLINGUAL_TABLET | SUBLINGUAL | Status: DC | PRN

## 2014-08-04 NOTE — H&P (Addendum)
Marissa [facility] is an 69 y.o. female.    Pcp: unassigned  Chief Complaint: n/v HPI: 69 yo female with dm2, cad s/p MI, stent apparently n/v, no bloody emesis apparently starting 4am today.  + diarrhea.  +abd pain, epigastric. "sharp"  w ? Radiation to the back.  Fairly constant.  Pt denies fever, chills, constipation, brbpr, black stool.  Denies nsaid use.  Pt had CT scan which showed evidence of cholelithiasis, and u/s recommended.  Pt will be admitted for evaluation of n/v, diarrhea, abd pain.   Past Medical History  Diagnosis Date  . CAD (coronary artery disease)     a. 10/2012 Ant STEMI/PCI: LM 20-30d, LAD 50/29m(2.5x24 Promus Premier DES), D1 50ost, LCX 50ost, OM1 50/50, RCa 528m, 50-70d @ bifurcation of PDA/PLA, EF 45%.  . Tobacco abuse   . Hyperlipidemia   . Myocardial infarction   . Type II diabetes mellitus, uncontrolled 11/08/2012  . Diabetes mellitus without complication     Past Surgical History  Procedure Laterality Date  . Laparoscopy with tubal ligation    . Tubal ligation    . Coronary stent placement    . Left heart catheterization with coronary angiogram N/A 11/06/2012    Procedure: LEFT HEART CATHETERIZATION WITH CORONARY ANGIOGRAM;  Surgeon: MiBlane OharaMD;  [facility];  Service: Cardiovascular;  Laterality: N/A;  . Appendectomy      Family History  Problem Relation Age of Onset  . Diabetes Maternal Aunt    Social History:  reports that she quit smoking about 20 months ago. Her smoking use included Cigarettes. She has a 50 pack-year smoking history. She has never used smokeless tobacco. She reports that she drinks alcohol. She reports that she does not use illicit drugs.  Allergies:  Allergies  Allergen Reactions  . Cheese Hives    On chin  . Aspirin Rash   Medications reviewed.   Results for orders placed or performed during the hospital encounter of 08/04/14 (from the past 48 hour(s))  CBC with Differential     Status: Abnormal    Collection Time: 08/04/14  5:48 PM  Result Value Ref Range   WBC 9.0 4.0 - 10.5 K/uL   RBC 4.82 3.87 - 5.11 MIL/uL   Hemoglobin 13.4 12.0 - 15.0 g/dL   HCT 40.9 36.0 - 46.0 %   MCV 84.9 78.0 - 100.0 fL   MCH 27.8 26.0 - 34.0 pg   MCHC 32.8 30.0 - 36.0 g/dL   RDW 14.9 11.5 - 15.5 %   Platelets 235 150 - 400 K/uL   Neutrophils Relative % 83 (H) 43 - 77 %   Neutro Abs 7.5 1.7 - 7.7 K/uL   Lymphocytes Relative 13 12 - 46 %   Lymphs Abs 1.2 0.7 - 4.0 K/uL   Monocytes Relative 4 3 - 12 %   Monocytes Absolute 0.3 0.1 - 1.0 K/uL   Eosinophils Relative 0 0 - 5 %   Eosinophils Absolute 0.0 0.0 - 0.7 K/uL   Basophils Relative 0 0 - 1 %   Basophils Absolute 0.0 0.0 - 0.1 K/uL  Comprehensive metabolic panel     Status: Abnormal   Collection Time: 08/04/14  5:48 PM  Result Value Ref Range   Sodium 134 (L) 135 - 145 mmol/L   Potassium 3.8 3.5 - 5.1 mmol/L   Chloride 97 (L) 101 - 111 mmol/L   CO2 27 22 - 32 mmol/L   Glucose, Bld 186 (H) 65 - 99  mg/dL   BUN 10 6 - 20 mg/dL   Creatinine, Ser 0.66 0.44 - 1.00 mg/dL   Calcium 8.7 (L) 8.9 - 10.3 mg/dL   Total Protein 8.0 6.5 - 8.1 g/dL   Albumin 4.4 3.5 - 5.0 g/dL   AST 25 15 - 41 U/L   ALT 25 14 - 54 U/L   Alkaline Phosphatase 98 38 - 126 U/L   Total Bilirubin 0.9 0.3 - 1.2 mg/dL   GFR calc non Af Amer >60 >60 mL/min   GFR calc Af Amer >60 >60 mL/min    Comment: (NOTE) The eGFR has been calculated using the CKD EPI equation. This calculation has not been validated in all clinical situations. eGFR's persistently <60 mL/min signify possible Chronic Kidney Disease.    Anion gap 10 5 - 15  Troponin I     Status: Abnormal   Collection Time: 08/04/14  5:48 PM  Result Value Ref Range   Troponin I 0.08 (H) <0.031 ng/mL    Comment:        PERSISTENTLY INCREASED TROPONIN VALUES IN THE RANGE OF 0.04-0.49 ng/mL CAN BE SEEN IN:       -UNSTABLE ANGINA       -CONGESTIVE HEART FAILURE       -MYOCARDITIS       -CHEST TRAUMA        -ARRYHTHMIAS       -LATE PRESENTING MYOCARDIAL INFARCTION       -COPD   CLINICAL FOLLOW-UP RECOMMENDED.   Lipase, blood     Status: None   Collection Time: 08/04/14  5:48 PM  Result Value Ref Range   Lipase 38 22 - 51 U/L  Urinalysis, Routine w reflex microscopic (not at [facility])     Status: Abnormal   Collection Time: 08/04/14  6:13 PM  Result Value Ref Range   Color, Urine YELLOW YELLOW   APPearance HAZY (A) CLEAR   Specific Gravity, Urine 1.025 1.005 - 1.030   pH 6.5 5.0 - 8.0   Glucose, UA NEGATIVE NEGATIVE mg/dL   Hgb urine dipstick TRACE (A) NEGATIVE   Bilirubin Urine NEGATIVE NEGATIVE   Ketones, ur NEGATIVE NEGATIVE mg/dL   Protein, ur 30 (A) NEGATIVE mg/dL   Urobilinogen, UA 0.2 0.0 - 1.0 mg/dL   Nitrite POSITIVE (A) NEGATIVE   Leukocytes, UA NEGATIVE NEGATIVE  Urine microscopic-add on     Status: Abnormal   Collection Time: 08/04/14  6:13 PM  Result Value Ref Range   Squamous Epithelial / LPF FEW (A) RARE   WBC, UA 0-2 <3 WBC/hpf   RBC / HPF 3-6 <3 RBC/hpf   Bacteria, UA MANY (A) RARE   Ct Abdomen Pelvis W Contrast  08/04/2014   CLINICAL DATA:  69 year old female with acute abdominal and pelvic pain with vomiting today.  EXAM: CT ABDOMEN AND PELVIS WITH CONTRAST  TECHNIQUE: Multidetector CT imaging of the abdomen and pelvis was performed using the standard protocol following bolus administration of intravenous contrast.  CONTRAST:  115m OMNIPAQUE IOHEXOL 300 MG/ML  SOLN  COMPARISON:  None.  FINDINGS: Please note that parenchymal abnormalities may be missed without intravenous contrast.  Lower chest:  Minimal basilar scarring noted.  Hepatobiliary: The liver is unremarkable. Cholelithiasis identified with mild distension of the gallbladder. One of the gallstones lies in the region of the gallbladder neck. There is no evidence of biliary dilatation.  Pancreas: Unremarkable  Spleen: Unremarkable  Adrenals/Urinary Tract: The kidneys, adrenal glands and bladder are unremarkable.   Stomach/Bowel: There is  no evidence of bowel obstruction or definite bowel wall thickening.  Vascular/Lymphatic: No enlarged lymph nodes or abdominal aortic aneurysm. Aortic atherosclerotic calcifications are noted.  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 The adnexal regions are unremarkable.  Other: Trace amount of free pelvic fluid is noted. There is no evidence of abscess or pneumoperitoneum.  Musculoskeletal: No acute or suspicious abnormalities identified.  IMPRESSION: Cholelithiasis and distended gallbladder with 1 gallstone in the region of the gallbladder neck. Consider abdominal ultrasound or nuclear medicine study if there is strong clinical suspicion for acute cholecystitis. No evidence of biliary dilatation.  Trace amount of free pelvic fluid of uncertain significance.   Electronically Signed   By: Margarette Canada M.D.   On: 08/04/2014 19:48    Review of Systems  Constitutional: Negative for fever, chills, weight loss, malaise/fatigue and diaphoresis.  HENT: Negative.   Eyes: Negative.   Respiratory: Negative.   Cardiovascular: Negative.   Gastrointestinal: Positive for nausea, vomiting, abdominal pain and diarrhea. Negative for heartburn, constipation, blood in stool and melena.  Genitourinary: Negative for dysuria, urgency, frequency, hematuria and flank pain.  Musculoskeletal: Negative.   Skin: Negative for itching and rash.  Neurological: Negative.  Negative for weakness.  Endo/Heme/Allergies: Negative.   Psychiatric/Behavioral: Negative.     Blood pressure 188/86, pulse 95, temperature 98.1 F (36.7 C), temperature source Oral, resp. rate 14, height '5\' 9"'  (1.753 m), weight 80.74 kg (178 lb), SpO2 94 %. Physical Exam  Constitutional: She is oriented to person, place, and time. She appears well-developed and well-nourished.  HENT:  Head: Normocephalic and atraumatic.  Mouth/Throat: No oropharyngeal exudate.  Eyes: Conjunctivae and EOM are normal. Pupils are equal,  round, and reactive to light. No scleral icterus.  Neck: Normal range of motion. Neck supple. No JVD present. No tracheal deviation present. No thyromegaly present.  Cardiovascular: Normal rate and regular rhythm.  Exam reveals no gallop and no friction rub.   No murmur heard. Respiratory: Effort normal and breath sounds normal. No respiratory distress. She has no wheezes. She has no rales.  GI: Soft. Bowel sounds are normal. She exhibits no distension. There is no tenderness. There is no rebound and no guarding.  Musculoskeletal: Normal range of motion. She exhibits no edema or tenderness.  Lymphadenopathy:    She has no cervical adenopathy.  Neurological: She is alert and oriented to person, place, and time. She has normal reflexes. She displays normal reflexes. No cranial nerve deficit. She exhibits normal muscle tone. Coordination normal.  Skin: Skin is warm and dry. No rash noted. No erythema. No pallor.  Psychiatric: She has a normal mood and affect. Her behavior is normal. Judgment and thought content normal.     Assessment/Plan N/v zofran 72m iv q6h prn Check CXR  Abdominal pain Check ultrasound Start on protonix  ED consulted Dr. JArnoldo Moralewho will be by in am, appreciate input.  Diarrhea Stool for fecal leukocytes, culture, c. Diff.    + trop Cont aspirin, lipitor, cont metoprolol  Dm2 fsbs q4h , iss  DVT prophylaxis:  Scd,  lovenox  KJani Gravel7/09/2014, 8:47 PM

## 2014-08-04 NOTE — ED Provider Notes (Signed)
CSN: [id number]     Arrival date & time 08/04/14  1721 History   First MD Initiated Contact with Patient 08/04/14 1726     Chief Complaint  Patient presents with  . Abdominal Pain     (Consider location/radiation/quality/duration/timing/severity/associated sxs/prior Treatment) HPI.... Generalized abdominal pain since 4 AM today with associated nausea, vomiting, diarrhea area status post cardiac stent in October 2014. Multiple episodes of vomiting today. No chest pain, dyspnea, fever, chills, dysuria.  Severity is moderate. Nothing makes symptoms better or worse.  Past Medical History  Diagnosis Date  . CAD (coronary artery disease)     a. 10/2012 Ant STEMI/PCI: LM 20-30d, LAD 50/36m (2.5x24 Promus Premier DES), D1 50ost, LCX 50ost, OM1 50/50, RCa 80m/d, 50-70d @ bifurcation of PDA/PLA, EF 45%.  . Tobacco abuse   . Hyperlipidemia   . Myocardial infarction   . Type II diabetes mellitus, uncontrolled 11/08/2012  . Diabetes mellitus without complication    Past Surgical History  Procedure Laterality Date  . Laparoscopy with tubal ligation    . Tubal ligation    . Coronary stent placement    . Left heart catheterization with coronary angiogram N/A 11/06/2012    Procedure: LEFT HEART CATHETERIZATION WITH CORONARY ANGIOGRAM;  Surgeon: Blane Ohara, MD;  Location[facility] CATH LAB;  Service: Cardiovascular;  Laterality: N/A;  . Appendectomy     Family History  Problem Relation Age of Onset  . Diabetes Maternal Aunt    History  Substance Use Topics  . Smoking status: Former Smoker -- 1.00 packs/day for 50 years    Types: Cigarettes    Quit date: 11/05/2012  . Smokeless tobacco: Never Used  . Alcohol Use: 0.0 oz/week    0 Standard drinks or equivalent per week     Comment: Drinks at social events   OB History    No data available     Review of Systems  All other systems reviewed and are negative.     Allergies  Cheese and Aspirin  Home Medications   Prior to Admission  medications   Medication Sig Start Date End Date Taking? Authorizing Provider  acetaminophen (TYLENOL) 500 MG tablet Take 1,000 mg by mouth daily as needed for pain.   Yes Historical Provider, MD  alum & mag hydroxide-simeth (MAALOX/MYLANTA) 200-200-20 MG/5ML suspension Take 15 mLs by mouth every 6 (six) hours as needed for indigestion or heartburn.   Yes Historical Provider, MD  Ascorbic Acid (VITAMIN C PO) Take 1 tablet by mouth daily.   Yes Historical Provider, MD  aspirin EC 81 MG tablet Take 81 mg by mouth daily.   Yes Historical Provider, MD  atorvastatin (LIPITOR) 80 MG tablet TAKE 1 TABLET (80 MG TOTAL) BY MOUTH DAILY AT 6 PM. 11/22/13  Yes Arnoldo Lenis, MD  diphenhydrAMINE (BENADRYL) 25 MG tablet Take 50 mg by mouth daily as needed for allergies (congestion).    Yes Historical Provider, MD  enalapril (VASOTEC) 5 MG tablet Take 1 tablet (5 mg total) by mouth 2 (two) times daily. 06/13/14  Yes Arnoldo Lenis, MD  KRILL OIL PO Take 1 capsule by mouth daily.   Yes Historical Provider, MD  metoprolol tartrate (LOPRESSOR) 25 MG tablet Take 1 tablet (25 mg total) by mouth 2 (two) times daily. 06/13/14  Yes Arnoldo Lenis, MD  Multiple Vitamin (MULTIVITAMIN WITH MINERALS) TABS tablet Take 1 tablet by mouth daily.   Yes Historical Provider, MD  nitroGLYCERIN (NITROSTAT) 0.4 MG SL tablet Place 1 tablet (  0.4 mg total) under the tongue every 5 (five) minutes x 3 doses as needed for chest pain. 09/11/13  Yes Arnoldo Lenis, MD  pantoprazole (PROTONIX) 40 MG tablet TAKE 1 TABLET BY MOUTH EVERY DAY 07/05/14  Yes Arnoldo Lenis, MD  POTASSIUM PO Take 1 tablet by mouth daily. 99 mg daily   Yes Historical Provider, MD   BP 192/93 mmHg  Pulse 84  Temp(Src) 98.1 F (36.7 C) (Oral)  Resp 11  Ht 5\' 9"  (1.753 m)  Wt 178 lb (80.74 kg)  BMI 26.27 kg/m2  SpO2 95% Physical Exam  Constitutional: She is oriented to person, place, and time. She appears well-developed and well-nourished.  HENT:   Head: Normocephalic and atraumatic.  Eyes: Conjunctivae and EOM are normal. Pupils are equal, round, and reactive to light.  Neck: Normal range of motion. Neck supple.  Cardiovascular: Normal rate and regular rhythm.   Pulmonary/Chest: Effort normal and breath sounds normal.  Abdominal: Soft. Bowel sounds are normal.  Generalized abdominal tenderness  Musculoskeletal: Normal range of motion.  Neurological: She is alert and oriented to person, place, and time.  Skin: Skin is warm and dry.  Psychiatric: She has a normal mood and affect. Her behavior is normal.  Nursing note and vitals reviewed.   ED Course  Procedures (including critical care time) Labs Review Labs Reviewed  CBC WITH DIFFERENTIAL/PLATELET - Abnormal; Notable for the following:    Neutrophils Relative % 83 (*)    All other components within normal limits  COMPREHENSIVE METABOLIC PANEL - Abnormal; Notable for the following:    Sodium 134 (*)    Chloride 97 (*)    Glucose, Bld 186 (*)    Calcium 8.7 (*)    All other components within normal limits  URINALYSIS, ROUTINE W REFLEX MICROSCOPIC (NOT AT [facility]) - Abnormal; Notable for the following:    APPearance HAZY (*)    Hgb urine dipstick TRACE (*)    Protein, ur 30 (*)    Nitrite POSITIVE (*)    All other components within normal limits  TROPONIN I - Abnormal; Notable for the following:    Troponin I 0.08 (*)    All other components within normal limits  URINE MICROSCOPIC-ADD ON - Abnormal; Notable for the following:    Squamous Epithelial / LPF FEW (*)    Bacteria, UA MANY (*)    All other components within normal limits  LIPASE, BLOOD    Imaging Review Ct Abdomen Pelvis W Contrast  08/04/2014   CLINICAL DATA:  69 year old female with acute abdominal and pelvic pain with vomiting today.  EXAM: CT ABDOMEN AND PELVIS WITH CONTRAST  TECHNIQUE: Multidetector CT imaging of the abdomen and pelvis was performed using the standard protocol following bolus  administration of intravenous contrast.  CONTRAST:  196mL OMNIPAQUE IOHEXOL 300 MG/ML  SOLN  COMPARISON:  None.  FINDINGS: Please note that parenchymal abnormalities may be missed without intravenous contrast.  Lower chest:  Minimal basilar scarring noted.  Hepatobiliary: The liver is unremarkable. Cholelithiasis identified with mild distension of the gallbladder. One of the gallstones lies in the region of the gallbladder neck. There is no evidence of biliary dilatation.  Pancreas: Unremarkable  Spleen: Unremarkable  Adrenals/Urinary Tract: The kidneys, adrenal glands and bladder are unremarkable.  Stomach/Bowel: There is no evidence of bowel obstruction or definite bowel wall thickening.  Vascular/Lymphatic: No enlarged lymph nodes or abdominal aortic aneurysm. Aortic atherosclerotic calcifications are noted.  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 The adnexal  regions are unremarkable.  Other: Trace amount of free pelvic fluid is noted. There is no evidence of abscess or pneumoperitoneum.  Musculoskeletal: No acute or suspicious abnormalities identified.  IMPRESSION: Cholelithiasis and distended gallbladder with 1 gallstone in the region of the gallbladder neck. Consider abdominal ultrasound or nuclear medicine study if there is strong clinical suspicion for acute cholecystitis. No evidence of biliary dilatation.  Trace amount of free pelvic fluid of uncertain significance.   Electronically Signed   By: Margarette Canada M.D.   On: 08/04/2014 19:48     EKG Interpretation None      MDM   Final diagnoses:  Generalized abdominal pain  Elevated troponin    White count and liver functions normal. CT scan shows cholelithiasis and distended gallbladder with one gallstone in the region the gallbladder neck.  Troponin minimally elevated. Discussed with general surgery. Dr. Aviva Signs to consult. Will admit to general medicine.    Nat Christen, MD 08/04/14 2056

## 2014-08-04 NOTE — ED Notes (Signed)
Had diarrhea this morning at 0430 and has had vomiting x 12 today.  Bilateral lower abdominal pain.  Denies dysuria, burning or stinging w/urination. Denies hematuria.  Denies blood in stool.

## 2014-08-05 ENCOUNTER — Inpatient Hospital Stay (HOSPITAL_COMMUNITY): Payer: Medicare Other

## 2014-08-05 DIAGNOSIS — R9431 Abnormal electrocardiogram [ECG] [EKG]: Secondary | ICD-10-CM

## 2014-08-05 DIAGNOSIS — I2510 Atherosclerotic heart disease of native coronary artery without angina pectoris: Secondary | ICD-10-CM

## 2014-08-05 DIAGNOSIS — K8050 Calculus of bile duct without cholangitis or cholecystitis without obstruction: Secondary | ICD-10-CM

## 2014-08-05 LAB — COMPREHENSIVE METABOLIC PANEL
ALBUMIN: 4 g/dL (ref 3.5–5.0)
ALT: 22 U/L (ref 14–54)
AST: 26 U/L (ref 15–41)
Alkaline Phosphatase: 83 U/L (ref 38–126)
Anion gap: 11 (ref 5–15)
BILIRUBIN TOTAL: 1 mg/dL (ref 0.3–1.2)
BUN: 7 mg/dL (ref 6–20)
CHLORIDE: 98 mmol/L — AB (ref 101–111)
CO2: 24 mmol/L (ref 22–32)
Calcium: 8.3 mg/dL — ABNORMAL LOW (ref 8.9–10.3)
Creatinine, Ser: 0.63 mg/dL (ref 0.44–1.00)
GFR calc Af Amer: >60 mL/min (ref >60)
GFR calc non Af Amer: >60 mL/min (ref >60)
Glucose, Bld: 137 mg/dL — ABNORMAL HIGH (ref 65–99)
POTASSIUM: 3.9 mmol/L (ref 3.5–5.1)
Sodium: 133 mmol/L — ABNORMAL LOW (ref 135–145)
Total Protein: 7.4 g/dL (ref 6.5–8.1)

## 2014-08-05 LAB — GLUCOSE, CAPILLARY
GLUCOSE-CAPILLARY: 147 mg/dL — AB (ref 65–99)
Glucose-Capillary: 125 mg/dL — ABNORMAL HIGH (ref 65–99)
Glucose-Capillary: 138 mg/dL — ABNORMAL HIGH (ref 65–99)
Glucose-Capillary: 111 mg/dL — ABNORMAL HIGH (ref 65–99)

## 2014-08-05 LAB — CBC
HCT: 37.5 % (ref 36.0–46.0)
HEMOGLOBIN: 12.5 g/dL (ref 12.0–15.0)
MCH: 27.8 pg (ref 26.0–34.0)
MCHC: 33.3 g/dL (ref 30.0–36.0)
MCV: 83.5 fL (ref 78.0–100.0)
Platelets: 224 10*3/uL (ref 150–400)
RBC: 4.49 MIL/uL (ref 3.87–5.11)
RDW: 14.9 % (ref 11.5–15.5)
WBC: 9 10*3/uL (ref 4.0–10.5)

## 2014-08-05 LAB — TROPONIN I
TROPONIN I: 0.19 ng/mL — AB (ref <0.031)
Troponin I: 0.29 ng/mL — ABNORMAL HIGH (ref <0.031)
Troponin I: 0.25 ng/mL — ABNORMAL HIGH (ref <0.031)

## 2014-08-05 LAB — MAGNESIUM: Magnesium: 1.7 mg/dL (ref 1.7–2.4)

## 2014-08-05 MED ORDER — MAGNESIUM SULFATE 2 GM/50ML IV SOLN
2.0000 g | Freq: Once | INTRAVENOUS | Status: AC
  Administered 2014-08-05: 2 g via INTRAVENOUS
  Filled 2014-08-05: qty 50

## 2014-08-05 MED ORDER — HYDROMORPHONE HCL 1 MG/ML IJ SOLN
1.0000 mg | INTRAMUSCULAR | Status: DC | PRN
  Administered 2014-08-05 – 2014-08-09 (×16): 1 mg via INTRAVENOUS
  Filled 2014-08-05 (×15): qty 1

## 2014-08-05 MED ORDER — CALCIUM CARBONATE ANTACID 500 MG PO CHEW
1.0000 | CHEWABLE_TABLET | Freq: Three times a day (TID) | ORAL | Status: DC
  Administered 2014-08-05 – 2014-08-07 (×5): 200 mg via ORAL
  Filled 2014-08-05 (×5): qty 1

## 2014-08-05 MED ORDER — ENOXAPARIN SODIUM 80 MG/0.8ML ~~LOC~~ SOLN
1.0000 mg/kg | Freq: Two times a day (BID) | SUBCUTANEOUS | Status: DC
  Administered 2014-08-05 – 2014-08-07 (×5): 80 mg via SUBCUTANEOUS
  Filled 2014-08-05 (×5): qty 0.8

## 2014-08-05 MED ORDER — HYDROMORPHONE HCL 1 MG/ML IJ SOLN
INTRAMUSCULAR | Status: AC
  Filled 2014-08-05: qty 1

## 2014-08-05 MED ORDER — POTASSIUM CHLORIDE CRYS ER 20 MEQ PO TBCR
40.0000 meq | EXTENDED_RELEASE_TABLET | Freq: Once | ORAL | Status: AC
  Administered 2014-08-05: 40 meq via ORAL
  Filled 2014-08-05: qty 2

## 2014-08-05 NOTE — Progress Notes (Signed)
Notified MD of patient's increased troponin levels.  Patient has no complaints of pain at this time.  Received order for lovenox to be increased.  Will continue to monitor patient.

## 2014-08-05 NOTE — Progress Notes (Signed)
Notified by telemetry that patients QTc was prolonged and was running at 596-610. Patient was alert and oriented with no complaints. Patients VS were WNL. MD was notified. MD ordered an EKG. EKG was completed. Will continue to monitor patient at this time.

## 2014-08-05 NOTE — Progress Notes (Signed)
  Echocardiogram 2D Echocardiogram has been performed.  Lysle Rubens 08/05/2014, 12:23 PM

## 2014-08-05 NOTE — Progress Notes (Signed)
PROGRESS NOTE  Marissa Schneider MEQ:[id number] DOB: 03/26/1945 DOA: 08/04/2014 PCP: Andres Shad, MD  Summary: 69 year old woman with history of coronary artery disease, diabetes presented to the emergency department with vomiting, diarrhea, abdominal pain, epigastric pain. CT of the abdomen and pelvis revealed cholelithiasis with one gallstone in the region of the gallbladder neck. She was admitted for biliary colic. Subsequent found to have elevated troponins.  Assessment/Plan: 1. Biliary colic with associated vomiting, diarrhea, abdominal pain. Afebrile, VSS, no leukocytosis. 2. Elevated troponin, suspect demand ischemia. Pain free. Troponin appears to have peaked. Echocardiogram reassuring.  3. Coronary artery disease, status post stent placement October 2014. Continue ASA. 4. Modest hyponatremia. Monitor clinically.  5. Prolonged QT. Potassium and magnesium within normal limits. No arrythmias. 6. Diabetes mellitus type 2, stable CBG, AG 11. 7. Tobacco dependence in remission.   Feels better. Appears well. Discussed with Dr. Arnoldo Morale.  Diet advanced. Surgery in near future.  Continue to trend troponin. Cardiology consultation 7/11.  Stop Zofran. Recheck EKG in a.m.  BMP in a.m.  Code Status: full code DVT prophylaxis: Lovenox Family Communication: discussed with family at bedside Disposition Plan: home  Murray Hodgkins, MD  [facility]  Pager [phone] If 7PM-7AM, please contact night-coverage at [email or web address], password [facility] 08/05/2014, 11:25 AM  LOS: 1 day   Consultants: Gen. surgery   Procedures:  Echo Study Conclusions  - Left ventricle: The cavity size was normal. There was mild focal basal hypertrophy of the septum. Systolic function was normal. The estimated ejection fraction was in the range of 60% to 65%. Wall motion was normal; there were no regional wall motion abnormalities. Doppler parameters are consistent with abnormal left  ventricular relaxation (grade 1 diastolic dysfunction). Doppler parameters are consistent with high ventricular filling pressure. - Mitral valve: There was mild regurgitation.  Impressions:  - When compared to prior study, apical hypokinesis has resolved.  Antibiotics:    HPI/Subjective: Intermittent RUQ pain. No n/v today. One episode of chest pain earlier today. No n/v.  Objective: Filed Vitals:   08/04/14 2045 08/04/14 2136 08/05/14 0538 08/05/14 0832  BP:  186/72 118/63 123/47  Pulse: 84 77 80 77  Temp:  98.2 F (36.8 C) 98.2 F (36.8 C)   TempSrc:  Oral Oral   Resp: 11 16 16 18   Height:  5\' 9"  (1.753 m)    Weight:  79.5 kg (175 lb 4.3 oz) 77.474 kg (170 lb 12.8 oz)   SpO2: 95% 95% 94%     Intake/Output Summary (Last 24 hours) at 08/05/14 1125 Last data filed at 08/05/14 0834  Gross per 24 hour  Intake      0 ml  Output    403 ml  Net   -403 ml     Filed Weights   08/04/14 1732 08/04/14 2136 08/05/14 0538  Weight: 80.74 kg (178 lb) 79.5 kg (175 lb 4.3 oz) 77.474 kg (170 lb 12.8 oz)    Exam:     Afebrile, vital signs stable General:  Appears calm and comfortable Cardiovascular: RRR, no m/r/g. No LE edema. Telemetry: SR, no arrhythmias  Respiratory: CTA bilaterally, no w/r/r. Normal respiratory effort. Abdomen: soft, ntnd, no RUQ pain Psychiatric: grossly normal mood and affect, speech fluent and appropriate  New data reviewed: Troponins have peaked, 0.08  >> 0.17  >> 0.29 >> 0.25  Magnesium within normal limits Complete metabolic panel unremarkable CBC unremarkable  EKG 7/10 sinus rhythm. Prolonged QT. Compared to previous study, QT is longer.  Pertinent data since admission:  EKG on admission sinus rhythm, borderline prolonged QT Chest x-ray independently reviewed, no acute disease  Pending data: Hemoglobin A1c   Scheduled Meds: . sodium chloride   Intravenous STAT  . aspirin EC  81 mg Oral Daily  . atorvastatin  80 mg Oral q1800  .  enalapril  5 mg Oral BID  . enoxaparin (LOVENOX) injection  1 mg/kg Subcutaneous Q12H  . HYDROmorphone      . insulin aspart  0-9 Units Subcutaneous TID WC  . metoprolol tartrate  25 mg Oral BID  . nitroGLYCERIN  0.5 inch Topical 3 times per day  . pantoprazole (PROTONIX) IV  40 mg Intravenous Q12H  . sodium chloride  3 mL Intravenous Q12H   Continuous Infusions:   Principal Problem:   Biliary colic Active Problems:   CAD (coronary artery disease)   Type II diabetes mellitus, uncontrolled   Abdominal pain   Hyponatremia   Nausea & vomiting   Elevated troponin   Prolonged QT interval   Time spent 25 minutes

## 2014-08-05 NOTE — Consult Note (Signed)
Reason for Consult: Right upper quadrant abdominal pain Referring Physician: Hospitalist  Marissa Schneider is an 69 y.o. female.  HPI: Patient is a 69 year old black female who started experiencing right upper quadrant abdominal pain yesterday. She also had episodes of emesis and loose stools. She presented emergency room for further evaluation treatment. CT scan of the abdomen revealed cholelithiasis with a distended gallbladder. Her liver enzyme tests were within normal limits. Her white blood cell count was within normal limits. She does have a history of coronary artery disease and an MI, and her initial troponins were noted to be elevated. She denies any chest pain. She states she has no history of fatty food intolerance. She was admitted overnight for further evaluation treatment. She states she is still tender in the right upper quadrant to palpation. She has had no further episodes of emesis.  Past Medical History  Diagnosis Date  . CAD (coronary artery disease)     a. 10/2012 Ant STEMI/PCI: LM 20-30d, LAD 50/34m(2.5x24 Promus Premier DES), D1 50ost, LCX 50ost, OM1 50/50, RCa 582m, 50-70d @ bifurcation of PDA/PLA, EF 45%.  . Tobacco abuse   . Hyperlipidemia   . Myocardial infarction   . Type II diabetes mellitus, uncontrolled 11/08/2012  . Diabetes mellitus without complication     Past Surgical History  Procedure Laterality Date  . Laparoscopy with tubal ligation    . Tubal ligation    . Coronary stent placement    . Left heart catheterization with coronary angiogram N/A 11/06/2012    Procedure: LEFT HEART CATHETERIZATION WITH CORONARY ANGIOGRAM;  Surgeon: MiBlane OharaMD;  [facility];  Service: Cardiovascular;  Laterality: N/A;  . Appendectomy      Family History  Problem Relation Age of Onset  . Diabetes Maternal Aunt     Social History:  reports that she quit smoking about 20 months ago. Her smoking use included Cigarettes. She has a 50 pack-year  smoking history. She has never used smokeless tobacco. She reports that she drinks alcohol. She reports that she does not use illicit drugs.  Allergies:  Allergies  Allergen Reactions  . Cheese Hives    On chin  . Aspirin Rash    Medications: I have reviewed the patient's current medications.  Results for orders placed or performed during the hospital encounter of 08/04/14 (from the past 48 hour(s))  CBC with Differential     Status: Abnormal   Collection Time: 08/04/14  5:48 PM  Result Value Ref Range   WBC 9.0 4.0 - 10.5 K/uL   RBC 4.82 3.87 - 5.11 MIL/uL   Hemoglobin 13.4 12.0 - 15.0 g/dL   HCT 40.9 36.0 - 46.0 %   MCV 84.9 78.0 - 100.0 fL   MCH 27.8 26.0 - 34.0 pg   MCHC 32.8 30.0 - 36.0 g/dL   RDW 14.9 11.5 - 15.5 %   Platelets 235 150 - 400 K/uL   Neutrophils Relative % 83 (H) 43 - 77 %   Neutro Abs 7.5 1.7 - 7.7 K/uL   Lymphocytes Relative 13 12 - 46 %   Lymphs Abs 1.2 0.7 - 4.0 K/uL   Monocytes Relative 4 3 - 12 %   Monocytes Absolute 0.3 0.1 - 1.0 K/uL   Eosinophils Relative 0 0 - 5 %   Eosinophils Absolute 0.0 0.0 - 0.7 K/uL   Basophils Relative 0 0 - 1 %   Basophils Absolute 0.0 0.0 - 0.1 K/uL  Comprehensive metabolic panel  Status: Abnormal   Collection Time: 08/04/14  5:48 PM  Result Value Ref Range   Sodium 134 (L) 135 - 145 mmol/L   Potassium 3.8 3.5 - 5.1 mmol/L   Chloride 97 (L) 101 - 111 mmol/L   CO2 27 22 - 32 mmol/L   Glucose, Bld 186 (H) 65 - 99 mg/dL   BUN 10 6 - 20 mg/dL   Creatinine, Ser 0.66 0.44 - 1.00 mg/dL   Calcium 8.7 (L) 8.9 - 10.3 mg/dL   Total Protein 8.0 6.5 - 8.1 g/dL   Albumin 4.4 3.5 - 5.0 g/dL   AST 25 15 - 41 U/L   ALT 25 14 - 54 U/L   Alkaline Phosphatase 98 38 - 126 U/L   Total Bilirubin 0.9 0.3 - 1.2 mg/dL   GFR calc non Af Amer >60 >60 mL/min   GFR calc Af Amer >60 >60 mL/min    Comment: (NOTE) The eGFR has been calculated using the CKD EPI equation. This calculation has not been validated in all clinical  situations. eGFR's persistently <60 mL/min signify possible Chronic Kidney Disease.    Anion gap 10 5 - 15  Troponin I     Status: Abnormal   Collection Time: 08/04/14  5:48 PM  Result Value Ref Range   Troponin I 0.08 (H) <0.031 ng/mL    Comment:        PERSISTENTLY INCREASED TROPONIN VALUES IN THE RANGE OF 0.04-0.49 ng/mL CAN BE SEEN IN:       -UNSTABLE ANGINA       -CONGESTIVE HEART FAILURE       -MYOCARDITIS       -CHEST TRAUMA       -ARRYHTHMIAS       -LATE PRESENTING MYOCARDIAL INFARCTION       -COPD   CLINICAL FOLLOW-UP RECOMMENDED.   Lipase, blood     Status: None   Collection Time: 08/04/14  5:48 PM  Result Value Ref Range   Lipase 38 22 - 51 U/L  Urinalysis, Routine w reflex microscopic (not at [facility])     Status: Abnormal   Collection Time: 08/04/14  6:13 PM  Result Value Ref Range   Color, Urine YELLOW YELLOW   APPearance HAZY (A) CLEAR   Specific Gravity, Urine 1.025 1.005 - 1.030   pH 6.5 5.0 - 8.0   Glucose, UA NEGATIVE NEGATIVE mg/dL   Hgb urine dipstick TRACE (A) NEGATIVE   Bilirubin Urine NEGATIVE NEGATIVE   Ketones, ur NEGATIVE NEGATIVE mg/dL   Protein, ur 30 (A) NEGATIVE mg/dL   Urobilinogen, UA 0.2 0.0 - 1.0 mg/dL   Nitrite POSITIVE (A) NEGATIVE   Leukocytes, UA NEGATIVE NEGATIVE  Urine microscopic-add on     Status: Abnormal   Collection Time: 08/04/14  6:13 PM  Result Value Ref Range   Squamous Epithelial / LPF FEW (A) RARE   WBC, UA 0-2 <3 WBC/hpf   RBC / HPF 3-6 <3 RBC/hpf   Bacteria, UA MANY (A) RARE  Troponin I (q 6hr x 3)     Status: Abnormal   Collection Time: 08/04/14 10:15 PM  Result Value Ref Range   Troponin I 0.17 (H) <0.031 ng/mL    Comment:        PERSISTENTLY INCREASED TROPONIN VALUES IN THE RANGE OF 0.04-0.49 ng/mL CAN BE SEEN IN:       -UNSTABLE ANGINA       -CONGESTIVE HEART FAILURE       -MYOCARDITIS       -  CHEST TRAUMA       -ARRYHTHMIAS       -LATE PRESENTING MYOCARDIAL INFARCTION       -COPD   CLINICAL FOLLOW-UP  RECOMMENDED.   Troponin I (q 6hr x 3)     Status: Abnormal   Collection Time: 08/05/14  4:22 AM  Result Value Ref Range   Troponin I 0.29 (H) <0.031 ng/mL    Comment:        PERSISTENTLY INCREASED TROPONIN VALUES IN THE RANGE OF 0.04-0.49 ng/mL CAN BE SEEN IN:       -UNSTABLE ANGINA       -CONGESTIVE HEART FAILURE       -MYOCARDITIS       -CHEST TRAUMA       -ARRYHTHMIAS       -LATE PRESENTING MYOCARDIAL INFARCTION       -COPD   CLINICAL FOLLOW-UP RECOMMENDED.   Comprehensive metabolic panel     Status: Abnormal   Collection Time: 08/05/14  5:00 AM  Result Value Ref Range   Sodium 133 (L) 135 - 145 mmol/L   Potassium 3.9 3.5 - 5.1 mmol/L   Chloride 98 (L) 101 - 111 mmol/L   CO2 24 22 - 32 mmol/L   Glucose, Bld 137 (H) 65 - 99 mg/dL   BUN 7 6 - 20 mg/dL   Creatinine, Ser 0.63 0.44 - 1.00 mg/dL   Calcium 8.3 (L) 8.9 - 10.3 mg/dL   Total Protein 7.4 6.5 - 8.1 g/dL   Albumin 4.0 3.5 - 5.0 g/dL   AST 26 15 - 41 U/L   ALT 22 14 - 54 U/L   Alkaline Phosphatase 83 38 - 126 U/L   Total Bilirubin 1.0 0.3 - 1.2 mg/dL   GFR calc non Af Amer >60 >60 mL/min   GFR calc Af Amer >60 >60 mL/min    Comment: (NOTE) The eGFR has been calculated using the CKD EPI equation. This calculation has not been validated in all clinical situations. eGFR's persistently <60 mL/min signify possible Chronic Kidney Disease.    Anion gap 11 5 - 15  CBC     Status: None   Collection Time: 08/05/14  5:00 AM  Result Value Ref Range   WBC 9.0 4.0 - 10.5 K/uL   RBC 4.49 3.87 - 5.11 MIL/uL   Hemoglobin 12.5 12.0 - 15.0 g/dL   HCT 37.5 36.0 - 46.0 %   MCV 83.5 78.0 - 100.0 fL   MCH 27.8 26.0 - 34.0 pg   MCHC 33.3 30.0 - 36.0 g/dL   RDW 14.9 11.5 - 15.5 %   Platelets 224 150 - 400 K/uL  Glucose, capillary     Status: Abnormal   Collection Time: 08/05/14  7:50 AM  Result Value Ref Range   Glucose-Capillary 125 (H) 65 - 99 mg/dL   Comment 1 Notify RN    Comment 2 Document in Chart     Dg Chest 2  View  08/04/2014   CLINICAL DATA:  Nausea, vomiting.  EXAM: CHEST  2 VIEW  COMPARISON:  November 06, 2012.  FINDINGS: The heart size and mediastinal contours are within normal limits. Both lungs are clear. No pneumothorax or pleural effusion is noted. The visualized skeletal structures are unremarkable.  IMPRESSION: No active cardiopulmonary disease.   Electronically Signed   By: Marijo Conception, M.D.   On: 08/04/2014 21:38   Ct Abdomen Pelvis W Contrast  08/04/2014   CLINICAL DATA:  69 year old female with acute abdominal and  pelvic pain with vomiting today.  EXAM: CT ABDOMEN AND PELVIS WITH CONTRAST  TECHNIQUE: Multidetector CT imaging of the abdomen and pelvis was performed using the standard protocol following bolus administration of intravenous contrast.  CONTRAST:  170m OMNIPAQUE IOHEXOL 300 MG/ML  SOLN  COMPARISON:  None.  FINDINGS: Please note that parenchymal abnormalities may be missed without intravenous contrast.  Lower chest:  Minimal basilar scarring noted.  Hepatobiliary: The liver is unremarkable. Cholelithiasis identified with mild distension of the gallbladder. One of the gallstones lies in the region of the gallbladder neck. There is no evidence of biliary dilatation.  Pancreas: Unremarkable  Spleen: Unremarkable  Adrenals/Urinary Tract: The kidneys, adrenal glands and bladder are unremarkable.  Stomach/Bowel: There is no evidence of bowel obstruction or definite bowel wall thickening.  Vascular/Lymphatic: No enlarged lymph nodes or abdominal aortic aneurysm. Aortic atherosclerotic calcifications are noted.  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 The adnexal regions are unremarkable.  Other: Trace amount of free pelvic fluid is noted. There is no evidence of abscess or pneumoperitoneum.  Musculoskeletal: No acute or suspicious abnormalities identified.  IMPRESSION: Cholelithiasis and distended gallbladder with 1 gallstone in the region of the gallbladder neck. Consider abdominal  ultrasound or nuclear medicine study if there is strong clinical suspicion for acute cholecystitis. No evidence of biliary dilatation.  Trace amount of free pelvic fluid of uncertain significance.   Electronically Signed   By: JMargarette CanadaM.D.   On: 08/04/2014 19:48    ROS: See chart Blood pressure 123/47, pulse 77, temperature 98.2 F (36.8 C), temperature source Oral, resp. rate 18, height _0  (1.753 m), weight 77.474 kg (170 lb 12.8 oz), SpO2 94 %. Physical Exam: Pleasant black female in no acute distress. Abdomen soft with tenderness noted in the right upper quadrant to deep palpation. No rigidity is noted. No hepatosplenomegaly is noted.   Assessment/Plan: Impression: Biliary colic, cholelithiasis, elevated troponins. Plan: Patient will be a laparoscopic cholecystectomy once cleared by cardiology. Have discussed care with Dr. GSarajane Jews The risks and benefits of the procedure including bleeding, infection, hepatobiliary injury, and the possibility of an open procedure were fully explained to the patient, who gave informed consent.  Alanee Ting A 08/05/2014, 10:24 AM

## 2014-08-06 ENCOUNTER — Inpatient Hospital Stay (HOSPITAL_COMMUNITY): Payer: Medicare Other

## 2014-08-06 DIAGNOSIS — R7989 Other specified abnormal findings of blood chemistry: Secondary | ICD-10-CM

## 2014-08-06 DIAGNOSIS — Z01810 Encounter for preprocedural cardiovascular examination: Secondary | ICD-10-CM

## 2014-08-06 DIAGNOSIS — I4581 Long QT syndrome: Secondary | ICD-10-CM

## 2014-08-06 DIAGNOSIS — I2510 Atherosclerotic heart disease of native coronary artery without angina pectoris: Secondary | ICD-10-CM

## 2014-08-06 LAB — BASIC METABOLIC PANEL
ANION GAP: 8 (ref 5–15)
BUN: 10 mg/dL (ref 6–20)
CHLORIDE: 102 mmol/L (ref 101–111)
CO2: 27 mmol/L (ref 22–32)
CREATININE: 0.78 mg/dL (ref 0.44–1.00)
Calcium: 8.3 mg/dL — ABNORMAL LOW (ref 8.9–10.3)
GFR calc Af Amer: >60 mL/min (ref >60)
GFR calc non Af Amer: >60 mL/min (ref >60)
Glucose, Bld: 109 mg/dL — ABNORMAL HIGH (ref 65–99)
Potassium: 3.8 mmol/L (ref 3.5–5.1)
Sodium: 137 mmol/L (ref 135–145)

## 2014-08-06 LAB — TROPONIN I: Troponin I: 0.1 ng/mL — ABNORMAL HIGH (ref <0.031)

## 2014-08-06 LAB — GLUCOSE, CAPILLARY
GLUCOSE-CAPILLARY: 111 mg/dL — AB (ref 65–99)
GLUCOSE-CAPILLARY: 129 mg/dL — AB (ref 65–99)
Glucose-Capillary: 116 mg/dL — ABNORMAL HIGH (ref 65–99)
Glucose-Capillary: 96 mg/dL (ref 65–99)
Glucose-Capillary: 112 mg/dL — ABNORMAL HIGH (ref 65–99)
Glucose-Capillary: 114 mg/dL — ABNORMAL HIGH (ref 65–99)

## 2014-08-06 LAB — HEMOGLOBIN A1C
HEMOGLOBIN A1C: 7.1 % — AB (ref 4.8–5.6)
Mean Plasma Glucose: 157 mg/dL

## 2014-08-06 LAB — TSH: TSH: 0.841 u[IU]/mL (ref 0.350–4.500)

## 2014-08-06 NOTE — Progress Notes (Signed)
Patient has a BP of 102/51 and HR of 72. Has nitro paste, lopressor, and vasotec scheduled to be given now. Contacted hospitalist on call for further instruction on whether to hold any of those medications. Was advised to give lopressor but hold nitro paste and vasotec tonight. Will continue to monitor.

## 2014-08-06 NOTE — Care Management Note (Signed)
Case Management Note  Patient Details  Name: Marissa Schneider MRN: [id number] Date of Birth: 07-19-1945  Subjective/Objective:                  Pt admitted from home with nausea and vomiting. Pt lives with her son and will return home at discharge. Pt is independent with ADL's.  Action/Plan: No Cm needs noted.  Expected Discharge Date:                  Expected Discharge Plan:  Home/Self Care  In-House Referral:  NA  Discharge planning Services  CM Consult  Post Acute Care Choice:  NA Choice offered to:  NA  DME Arranged:    DME Agency:     HH Arranged:    HH Agency:     Status of Service:  Completed, signed off  Medicare Important Message Given:    Date Medicare IM Given:    Medicare IM give by:    Date Additional Medicare IM Given:    Additional Medicare Important Message give by:     If discussed at [place] of Stay Meetings, dates discussed:    Additional Comments:  Joylene Draft, RN 08/06/2014, 11:13 AM

## 2014-08-06 NOTE — Progress Notes (Signed)
Awaiting cardiology input. Should patient be cleared for surgery, will perform laparoscopic cholecystectomy tomorrow. The risks and benefits of the procedure including bleeding, infection, hepatobiliary injury, and the possibility of an open procedure were fully explained to the patient, who gave informed consent.

## 2014-08-06 NOTE — Progress Notes (Signed)
PROGRESS NOTE  Marissa Schneider IWP:[id number] DOB: 1945/08/29 DOA: 08/04/2014 PCP: Andres Shad, MD  Summary: 69 year old woman with history of coronary artery disease, diabetes presented to the emergency department with vomiting, diarrhea, abdominal pain, epigastric pain. CT of the abdomen and pelvis revealed cholelithiasis with one gallstone in the region of the gallbladder neck. She was admitted for biliary colic. Subsequent found to have elevated troponins.  Assessment/Plan: 1. Biliary colic with associated vomiting, diarrhea, abdominal pain. Stable. Afebrile. No antibiotics per surgery. 2. Elevated troponin, suspect demand ischemia. Troponin trending down. No chest pain. Cardiology plans nuclear stress testing prior to surgery. 3. Coronary artery disease, status post stent placement October 2014. Continue ASA. 4. Modest hyponatremia. Resolved. 5. Prolonged QT. Potassium and magnesium within normal limits. Much improved. No arrhythmias. 6. Diabetes mellitus type 2, remains stable. 7. Tobacco dependence in remission.   Overall appears stable. No fever. No evidence of acute cholecystitis.  Appreciate cardiology recommendations, plan for nuclear stress testing 7/12. Hold surgery until results available and cardiology has cleared the patient.  Code Status: full code DVT prophylaxis: Lovenox Family Communication: discussed with family at bedside Disposition Plan: home  Murray Hodgkins, MD  [facility]  Pager [phone] If 7PM-7AM, please contact night-coverage at [email or web address], password [facility] 08/06/2014, 1:36 PM  LOS: 2 days   Consultants: Gen. surgery   Procedures:  Echo Study Conclusions  - Left ventricle: The cavity size was normal. There was mild focal basal hypertrophy of the septum. Systolic function was normal. The estimated ejection fraction was in the range of 60% to 65%. Wall motion was normal; there were no regional wall  motion abnormalities. Doppler parameters are consistent with abnormal left ventricular relaxation (grade 1 diastolic dysfunction). Doppler parameters are consistent with high ventricular filling pressure. - Mitral valve: There was mild regurgitation.  Impressions:  - When compared to prior study, apical hypokinesis has resolved.  Antibiotics:    HPI/Subjective: Some abd pain earlier today. No chest pain.  Objective: Filed Vitals:   08/05/14 1418 08/05/14 2152 08/06/14 0500 08/06/14 0630  BP: 122/67 122/39  119/39  Pulse: 98 75  79  Temp: 99.4 F (37.4 C) 99.5 F (37.5 C)  98.3 F (36.8 C)  TempSrc: Oral Oral  Oral  Resp: 18 18  18   Height:      Weight:   76.431 kg (168 lb 8 oz)   SpO2: 95% 95%  93%    Intake/Output Summary (Last 24 hours) at 08/06/14 1336 Last data filed at 08/06/14 0900  Gross per 24 hour  Intake    240 ml  Output    400 ml  Net   -160 ml     Filed Weights   08/04/14 2136 08/05/14 0538 08/06/14 0500  Weight: 79.5 kg (175 lb 4.3 oz) 77.474 kg (170 lb 12.8 oz) 76.431 kg (168 lb 8 oz)    Exam:     Afebrile, vital signs stable, no hypoxia General: Appears calm and comfortable Cardiovascular: RRR, no m/r/g.  Telemetry: SR, no arrhythmias  Respiratory: CTA bilaterally, no w/r/r. Normal respiratory effort. Abdomen: soft, mild upper pain, nondistended Psychiatric: grossly normal mood and affect, speech fluent and appropriate  New data reviewed: Troponins trending towards normal, last 7.34 Basic metabolic panel unremarkable. EKG sinus rhythm. QT much improved.  Pertinent data since admission: EKG on admission sinus rhythm, borderline prolonged QT EKG 7/10 sinus rhythm. Prolonged QT. Compared to previous study, QT is longer. Chest x-ray independently reviewed, no acute disease  Pending data: Hemoglobin A1c  Scheduled Meds: . aspirin EC  81 mg Oral Daily  . atorvastatin  80 mg Oral q1800  . calcium carbonate  1 tablet Oral TID   . enalapril  5 mg Oral BID  . enoxaparin (LOVENOX) injection  1 mg/kg Subcutaneous Q12H  . insulin aspart  0-9 Units Subcutaneous TID WC  . metoprolol tartrate  25 mg Oral BID  . nitroGLYCERIN  0.5 inch Topical 3 times per day  . pantoprazole (PROTONIX) IV  40 mg Intravenous Q12H  . sodium chloride  3 mL Intravenous Q12H   Continuous Infusions:   Principal Problem:   Biliary colic Active Problems:   CAD (coronary artery disease)   Type II diabetes mellitus, uncontrolled   Abdominal pain   Hyponatremia   Nausea & vomiting   Elevated troponin   Prolonged QT interval   Preop cardiovascular exam   Time spent 15 minutes

## 2014-08-06 NOTE — Consult Note (Signed)
Primary cardiologist: Dr Carlyle Dolly MD Consulting cardiologist: Dr Carlyle Dolly MD  Clinical Summary Ms. Corredor is a 69 y.o.female history of CAD with prior anterior STEMI 10/2012 s/p DES to LAD, HL, DM2, admitted with N/V, diarrhea, and abdominal pain. She denies any chest pain, no SOB or DOE.    Lipase 38, K 3.8, Cr 0.66, Hgb 13.4, WBC 9, Plt 235,  Trop 0.08-->0.17-->0.29-->0.25-->0.19-->0.10 CT A/P: cholelithiasis with distended gallbladder, no biliary dilatation CXR no acute process Abd [place] pending EKG SR, no acute ischemic changes Echo 07/2014 LVEF 60-65%, no WMAs, grade I diastolic dysfunction.     Allergies  Allergen Reactions  . Cheese Hives    On chin  . Aspirin Rash    Medications Scheduled Medications: . aspirin EC  81 mg Oral Daily  . atorvastatin  80 mg Oral q1800  . calcium carbonate  1 tablet Oral TID  . enalapril  5 mg Oral BID  . enoxaparin (LOVENOX) injection  1 mg/kg Subcutaneous Q12H  . insulin aspart  0-9 Units Subcutaneous TID WC  . metoprolol tartrate  25 mg Oral BID  . nitroGLYCERIN  0.5 inch Topical 3 times per day  . pantoprazole (PROTONIX) IV  40 mg Intravenous Q12H  . sodium chloride  3 mL Intravenous Q12H     Infusions:     PRN Medications:  HYDROmorphone (DILAUDID) injection, nitroGLYCERIN   Past Medical History  Diagnosis Date  . CAD (coronary artery disease)     a. 10/2012 Ant STEMI/PCI: LM 20-30d, LAD 50/13m (2.5x24 Promus Premier DES), D1 50ost, LCX 50ost, OM1 50/50, RCa 53m/d, 50-70d @ bifurcation of PDA/PLA, EF 45%.  . Tobacco abuse   . Hyperlipidemia   . Myocardial infarction   . Type II diabetes mellitus, uncontrolled 11/08/2012  . Diabetes mellitus without complication     Past Surgical History  Procedure Laterality Date  . Laparoscopy with tubal ligation    . Tubal ligation    . Coronary stent placement    . Left heart catheterization with coronary angiogram N/A 11/06/2012    Procedure: LEFT HEART  CATHETERIZATION WITH CORONARY ANGIOGRAM;  Surgeon: Blane Ohara, MD;  Location: [facility] CATH LAB;  Service: Cardiovascular;  Laterality: N/A;  . Appendectomy      Family History  Problem Relation Age of Onset  . Diabetes Maternal Aunt     Social History Ms. Melka reports that she quit smoking about 21 months ago. Her smoking use included Cigarettes. She has a 50 pack-year smoking history. She has never used smokeless tobacco. Ms. Recine reports that she drinks alcohol.  Review of Systems CONSTITUTIONAL: No weight loss, fever, chills, weakness or fatigue.  HEENT: Eyes: No visual loss, blurred vision, double vision or yellow sclerae. No hearing loss, sneezing, congestion, runny nose or sore throat.  SKIN: No rash or itching.  CARDIOVASCULAR: per HPI RESPIRATORY: No shortness of breath, cough or sputum.  GASTROINTESTINAL: per HPI NEUROLOGICAL: No headache, dizziness, syncope, paralysis, ataxia, numbness or tingling in the extremities. No change in bowel or bladder control.  MUSCULOSKELETAL: No muscle, back pain, joint pain or stiffness.  HEMATOLOGIC: No anemia, bleeding or bruising.  LYMPHATICS: No enlarged nodes. No history of splenectomy.  PSYCHIATRIC: No history of depression or anxiety.      Physical Examination Blood pressure 119/39, pulse 79, temperature 98.3 F (36.8 C), temperature source Oral, resp. rate 18, height 5\' 9"  (1.753 m), weight 168 lb 8 oz (76.431 kg), SpO2 93 %.  Intake/Output Summary (Last 24 hours)  at 08/06/14 1007 Last data filed at 08/06/14 0900  Gross per 24 hour  Intake    480 ml  Output    850 ml  Net   -370 ml    HEENT: sclera clear  Cardiovascular: RRR, no m/r/g, no JVD  Respiratory: CTAB  GI: abdomen soft, NT  MSK: no LE edema  Neuro: no focal deficits  Psych: appropriate affect   Lab Results  Basic Metabolic Panel:  Recent Labs Lab 08/04/14 1748 08/05/14 0500 08/05/14 1011 08/06/14 0600  NA 134* 133*  --  137  K 3.8 3.9   --  3.8  CL 97* 98*  --  102  CO2 27 24  --  27  GLUCOSE 186* 137*  --  109*  BUN 10 7  --  10  CREATININE 0.66 0.63  --  0.78  CALCIUM 8.7* 8.3*  --  8.3*  MG  --   --  1.7  --     Liver Function Tests:  Recent Labs Lab 08/04/14 1748 08/05/14 0500  AST 25 26  ALT 25 22  ALKPHOS 98 83  BILITOT 0.9 1.0  PROT 8.0 7.4  ALBUMIN 4.4 4.0    CBC:  Recent Labs Lab 08/04/14 1748 08/05/14 0500  WBC 9.0 9.0  NEUTROABS 7.5  --   HGB 13.4 12.5  HCT 40.9 37.5  MCV 84.9 83.5  PLT 235 224    Cardiac Enzymes:  Recent Labs Lab 08/04/14 2215 08/05/14 0422 08/05/14 1011 08/05/14 1916 08/06/14 0600  TROPONINI 0.17* 0.29* 0.25* 0.19* 0.10*    BNP: Invalid input(s): POCBNP     Impression/Recommendations 1. Elevated troponin - no ischemic changes on EKG, denies any chest pain or cardiac symptoms - echo with normal LVEF, no WMAs.  - I suspect demand ischemia in setting of symptomatic gallbladder disease, however based on her history of troponin elevation obstructive CAD remains a concern and would want to better risk stratify her prior to surgery with a Lexiscan. Will plan for lexiscan MPI tomorrow, will make npo at midnight.  2. Long QTc - unclear cause, has since normalized by EKG this AM at 450ms manually.  - potentially related to zofran. Can see some QT prolongation in setting of ischemia  3. Bilary colic - being considered for surgery - in setting of known CAD with prior stent, and recent troponin elevation will obtain Lexiscan MPI prior to surgery to better risk stratify. Please postpone surgery until cardiac workup is complete.  - continue cardiac meds  Carlyle Dolly, M.D.

## 2014-08-06 NOTE — Progress Notes (Signed)
Last night pt had a BP of 122/39 with a HR of 75. Hospitalist gave orders to administer the metoprolol but hold the vasotec and nitro paste. Pt's BP this morning was 119/39 with a HR of 79. Dr.Le gave orders to hold nitro paste this morning. Will continue to monitor

## 2014-08-07 ENCOUNTER — Inpatient Hospital Stay (HOSPITAL_COMMUNITY): Payer: Medicare Other

## 2014-08-07 ENCOUNTER — Encounter (HOSPITAL_COMMUNITY): Payer: Self-pay

## 2014-08-07 DIAGNOSIS — I248 Other forms of acute ischemic heart disease: Secondary | ICD-10-CM | POA: Insufficient documentation

## 2014-08-07 DIAGNOSIS — I2510 Atherosclerotic heart disease of native coronary artery without angina pectoris: Secondary | ICD-10-CM

## 2014-08-07 DIAGNOSIS — R1011 Right upper quadrant pain: Secondary | ICD-10-CM | POA: Insufficient documentation

## 2014-08-07 LAB — GLUCOSE, CAPILLARY
GLUCOSE-CAPILLARY: 83 mg/dL (ref 65–99)
Glucose-Capillary: 101 mg/dL — ABNORMAL HIGH (ref 65–99)
Glucose-Capillary: 104 mg/dL — ABNORMAL HIGH (ref 65–99)
Glucose-Capillary: 119 mg/dL — ABNORMAL HIGH (ref 65–99)
Glucose-Capillary: 175 mg/dL — ABNORMAL HIGH (ref 65–99)

## 2014-08-07 LAB — NM MYOCAR MULTI W/SPECT W/WALL MOTION / EF
CHL CUP NUCLEAR SRS: 1
CSEPPHR: 89 {beats}/min
LHR: 0
LV sys vol: 33 mL
LVDIAVOL: 69 mL
Rest HR: 70 {beats}/min
SDS: 0
SSS: 1
TID: 1

## 2014-08-07 LAB — CBC
HCT: 37.2 % (ref 36.0–46.0)
Hemoglobin: 11.6 g/dL — ABNORMAL LOW (ref 12.0–15.0)
MCH: 27 pg (ref 26.0–34.0)
MCHC: 31.2 g/dL (ref 30.0–36.0)
MCV: 86.5 fL (ref 78.0–100.0)
PLATELETS: 200 10*3/uL (ref 150–400)
RBC: 4.3 MIL/uL (ref 3.87–5.11)
RDW: 15.3 % (ref 11.5–15.5)
WBC: 5.9 10*3/uL (ref 4.0–10.5)

## 2014-08-07 LAB — SURGICAL PCR SCREEN
MRSA, PCR: NEGATIVE
Staphylococcus aureus: NEGATIVE

## 2014-08-07 LAB — CLOSTRIDIUM DIFFICILE BY PCR: CDIFFPCR: NEGATIVE

## 2014-08-07 MED ORDER — DIPHENHYDRAMINE-ZINC ACETATE 2-0.1 % EX CREA
TOPICAL_CREAM | Freq: Three times a day (TID) | CUTANEOUS | Status: DC | PRN
  Filled 2014-08-07: qty 28

## 2014-08-07 MED ORDER — PANTOPRAZOLE SODIUM 40 MG IV SOLR: 40.0000 mg | INTRAVENOUS | Status: DC

## 2014-08-07 MED ORDER — TECHNETIUM TC 99M SESTAMIBI - CARDIOLITE
10.0000 | Freq: Once | INTRAVENOUS | Status: AC | PRN
  Administered 2014-08-07: 07:00:00 9 via INTRAVENOUS

## 2014-08-07 MED ORDER — SODIUM CHLORIDE 0.9 % IJ SOLN
INTRAMUSCULAR | Status: AC
  Administered 2014-08-07: 10 mL via INTRAVENOUS
  Filled 2014-08-07: qty 3

## 2014-08-07 MED ORDER — REGADENOSON 0.4 MG/5ML IV SOLN
INTRAVENOUS | Status: AC
  Administered 2014-08-07: 0.4 mg via INTRAVENOUS
  Filled 2014-08-07: qty 5

## 2014-08-07 MED ORDER — TECHNETIUM TC 99M SESTAMIBI GENERIC - CARDIOLITE
30.0000 | Freq: Once | INTRAVENOUS | Status: AC | PRN
  Administered 2014-08-07: 29 via INTRAVENOUS

## 2014-08-07 MED ORDER — PANTOPRAZOLE SODIUM 40 MG PO TBEC
40.0000 mg | DELAYED_RELEASE_TABLET | Freq: Every day | ORAL | Status: DC
  Administered 2014-08-08 – 2014-08-09 (×2): 40 mg via ORAL
  Filled 2014-08-07 (×2): qty 1

## 2014-08-07 NOTE — Progress Notes (Signed)
Important Message  Patient Details  Name: Marissa Schneider MRN: [id number] Date of Birth: 1945/01/29   Medicare Important Message Given:  Yes-second notification given    Joylene Draft, RN 08/07/2014, 12:25 PM

## 2014-08-07 NOTE — Progress Notes (Signed)
PROGRESS NOTE  Marissa Schneider QMG:[id number] DOB: 01/23/1946 DOA: 08/04/2014 PCP: Andres Shad, MD  Summary: 69 year old woman with history of coronary artery disease, diabetes presented to the emergency department with vomiting, diarrhea, abdominal pain, epigastric pain. CT of the abdomen and pelvis revealed cholelithiasis with one gallstone in the region of the gallbladder neck. She was admitted for biliary colic and elevated troponins. She was seen by general surgery with plans made for cholecystectomy. Troponins were trended, patient seen by cardiology and felt to have demand ischemia with low risk nuclear stress test. Cleared for surgery which is scheduled for 7/13.  Assessment/Plan: 1. Biliary colic with associated vomiting, diarrhea, abdominal pain. Stable, no fever, normal WBC.  Abdominal ultrasound with gallstones and mild gallbladder distention. No sonographic Murphy sign. Plan for cholecystectomy. 2. Demand ischemia. Low risk nuclear stress test. Cleared for surgery per cardiology. 3. Coronary artery disease, status post stent placement October 2014. Continue ASA. Stable. 4. Modest hyponatremia. Resolved. 5. Prolonged QT. Resolved. Thought to be related to demand ischemia. 6. Diabetes mellitus type 2, remains stable. Hemoglobin A1c 7.1. Blood sugar stable. 7. Tobacco dependence in remission.   Stable. Cleared by cardiology for surgery.  Cholecystectomy planned 7/13.  Code Status: full code DVT prophylaxis: Lovenox Family Communication:  Disposition Plan: home  Murray Hodgkins, MD  [facility]  Pager [phone] If 7PM-7AM, please contact night-coverage at [email or web address], password [facility] 08/07/2014, 2:27 PM  LOS: 3 days   Consultants: Gen. surgery   Procedures:  Echo Study Conclusions  - Left ventricle: The cavity size was normal. There was mild focal basal hypertrophy of the septum. Systolic function was normal. The estimated ejection fraction was  in the range of 60% to 65%. Wall motion was normal; there were no regional wall motion abnormalities. Doppler parameters are consistent with abnormal left ventricular relaxation (grade 1 diastolic dysfunction). Doppler parameters are consistent with high ventricular filling pressure. - Mitral valve: There was mild regurgitation.  Impressions:  - When compared to prior study, apical hypokinesis has resolved.  Antibiotics:    HPI/Subjective: Overall feeling ok, tolerating diet. RUQ pain controlled with narcotics.  Objective: Filed Vitals:   08/06/14 0630 08/06/14 1421 08/06/14 2213 08/07/14 0607  BP: 119/39 120/64 102/51 116/51  Pulse: 79 76 72 73  Temp: 98.3 F (36.8 C) 98.8 F (37.1 C) 99.6 F (37.6 C) 99.1 F (37.3 C)  TempSrc: Oral Oral Oral Oral  Resp: 18 18 18 18   Height:      Weight:    76.34 kg (168 lb 4.8 oz)  SpO2: 93% 97% 95% 94%    Intake/Output Summary (Last 24 hours) at 08/07/14 1427 Last data filed at 08/07/14 1154  Gross per 24 hour  Intake    240 ml  Output   1600 ml  Net  -1360 ml     Filed Weights   08/05/14 0538 08/06/14 0500 08/07/14 0607  Weight: 77.474 kg (170 lb 12.8 oz) 76.431 kg (168 lb 8 oz) 76.34 kg (168 lb 4.8 oz)    Exam:     Afebrile, vital signs stable, no hypoxia General:  Appears comfortable, calm. Cardiovascular: Regular rate and rhythm, no murmur, rub or gallop.  Telemetry: Sinus rhythm, no arrhythmias  Respiratory: Clear to auscultation bilaterally, no wheezes, rales or rhonchi. Normal respiratory effort. Abdomen: soft, ntnd, mild RUQ pain Psychiatric: grossly normal mood and affect, speech fluent and appropriate  New data reviewed: CBC unremarkable.  Pertinent data since admission: EKG on admission sinus rhythm, borderline prolonged QT EKG  7/10 sinus rhythm. Prolonged QT. Compared to previous study, QT is longer. Chest x-ray independently reviewed, no acute disease  Pending data:   Scheduled Meds: .  atorvastatin  80 mg Oral q1800  . calcium carbonate  1 tablet Oral TID  . enalapril  5 mg Oral BID  . enoxaparin (LOVENOX) injection  1 mg/kg Subcutaneous Q12H  . insulin aspart  0-9 Units Subcutaneous TID WC  . metoprolol tartrate  25 mg Oral BID  . pantoprazole (PROTONIX) IV  40 mg Intravenous Q12H  . sodium chloride  3 mL Intravenous Q12H   Continuous Infusions:   Principal Problem:   Biliary colic Active Problems:   CAD (coronary artery disease)   Type II diabetes mellitus, uncontrolled   Abdominal pain   Hyponatremia   Nausea & vomiting   Elevated troponin   Prolonged QT interval   Preop cardiovascular exam   Time spent 15 minutes

## 2014-08-07 NOTE — Progress Notes (Signed)
Cardiology has cleared patient for surgery.  Will proceed with laparoscopic cholecystectomy tomorrow.  Risks and benefits of procedure including bleeding, infection, hepatobiliary injury, and the possibility of an open procedure were fully explained to the patient, who gives informed consent.

## 2014-08-07 NOTE — Progress Notes (Signed)
Consulting cardiologist:Branch, Roderic Palau MD Primary Cardiologist: Carlyle Dolly MD  Cardiology Specific Problem List:  1. CAD 2. Pre-Operative Evaluation for cholecystectomy  Subjective:    Complaining of RUQ abdominal pain.  Objective:   Temp:  [98.8 F (37.1 C)-99.6 F (37.6 C)] 99.1 F (37.3 C) (07/12 0607) Pulse Rate:  [72-76] 73 (07/12 0607) Resp:  [18] 18 (07/12 0607) BP: (102-120)/(51-64) 116/51 mmHg (07/12 0607) SpO2:  [94 %-97 %] 94 % (07/12 0607) Weight:  [168 lb 4.8 oz (76.34 kg)] 168 lb 4.8 oz (76.34 kg) (07/12 0607) Last BM Date: 08/05/14  Filed Weights   08/05/14 0538 08/06/14 0500 08/07/14 0607  Weight: 170 lb 12.8 oz (77.474 kg) 168 lb 8 oz (76.431 kg) 168 lb 4.8 oz (76.34 kg)    Intake/Output Summary (Last 24 hours) at 08/07/14 0909 Last data filed at 08/06/14 1700  Gross per 24 hour  Intake    360 ml  Output    500 ml  Net   -140 ml    Telemetry: NSR  Exam:  General: No acute distress.  HEENT: Conjunctiva and lids normal, oropharynx clear.  Lungs: Clear to auscultation, nonlabored.  Cardiac: No elevated JVP or bruits. RRR, no gallop or rub.   Abdomen: Normoactive bowel sounds, nontender, nondistended.  Extremities: No pitting edema, distal pulses full.  Neuropsychiatric: Alert and oriented x3, affect appropriate.   Lab Results:  Basic Metabolic Panel:  Recent Labs Lab 08/04/14 1748 08/05/14 0500 08/05/14 1011 08/06/14 0600  NA 134* 133*  --  137  K 3.8 3.9  --  3.8  CL 97* 98*  --  102  CO2 27 24  --  27  GLUCOSE 186* 137*  --  109*  BUN 10 7  --  10  CREATININE 0.66 0.63  --  0.78  CALCIUM 8.7* 8.3*  --  8.3*  MG  --   --  1.7  --     Liver Function Tests:  Recent Labs Lab 08/04/14 1748 08/05/14 0500  AST 25 26  ALT 25 22  ALKPHOS 98 83  BILITOT 0.9 1.0  PROT 8.0 7.4  ALBUMIN 4.4 4.0    CBC:  Recent Labs Lab 08/04/14 1748 08/05/14 0500 08/07/14 0552  WBC 9.0 9.0 5.9  HGB 13.4 12.5 11.6*    HCT 40.9 37.5 37.2  MCV 84.9 83.5 86.5  PLT 235 224 200    Cardiac Enzymes:  Recent Labs Lab 08/05/14 1011 08/05/14 1916 08/06/14 0600  TROPONINI 0.25* 0.19* 0.10*     Medications:   Scheduled Medications: . aspirin EC  81 mg Oral Daily  . atorvastatin  80 mg Oral q1800  . calcium carbonate  1 tablet Oral TID  . enalapril  5 mg Oral BID  . enoxaparin (LOVENOX) injection  1 mg/kg Subcutaneous Q12H  . insulin aspart  0-9 Units Subcutaneous TID WC  . metoprolol tartrate  25 mg Oral BID  . nitroGLYCERIN  0.5 inch Topical 3 times per day  . pantoprazole (PROTONIX) IV  40 mg Intravenous Q12H  . sodium chloride  3 mL Intravenous Q12H      PRN Medications: HYDROmorphone (DILAUDID) injection, nitroGLYCERIN, technetium sestamibi generic   Assessment and Plan:   1. CAD: Hx of DES to LAD 2014. She has had no chest pain. BP is stable. She has had NTG paste discontinued. Vasotec was on hold last night, for hypotension, likely from pain control. She is continue don BB, metoprolol 25 mg BID. Stress test this  am for evaluation of new ischemia prior to surgery.   2. Biliary Colic: Has had abdominal ultrasound today. To have chole tomorrow if clinically indicated, once stress test is completed and found to be normal, without evidence of new ischemia.    Phill Myron. Lawrence NP [facility]  08/07/2014, 9:09 AM   Low risk stress test findings, suspect mild troponin elevation was demand ischemia. Recommend proceeding with planned surgery, continue cardiac meds in perioperative period. If needed ok to hold ASA. We will sign off of inpatient care, call with questions.   Zandra Abts MD

## 2014-08-08 ENCOUNTER — Encounter (HOSPITAL_COMMUNITY)
Admission: EM | Disposition: A | Discharge status: Home / Self Care | Payer: Self-pay | Source: Home / Self Care | Attending: Family Medicine

## 2014-08-08 ENCOUNTER — Inpatient Hospital Stay (HOSPITAL_COMMUNITY): Payer: Medicare Other | Admitting: Anesthesiology

## 2014-08-08 DIAGNOSIS — R1011 Right upper quadrant pain: Secondary | ICD-10-CM

## 2014-08-08 DIAGNOSIS — I248 Other forms of acute ischemic heart disease: Secondary | ICD-10-CM

## 2014-08-08 DIAGNOSIS — K8020 Calculus of gallbladder without cholecystitis without obstruction: Secondary | ICD-10-CM | POA: Diagnosis not present

## 2014-08-08 DIAGNOSIS — E871 Hypo-osmolality and hyponatremia: Secondary | ICD-10-CM

## 2014-08-08 DIAGNOSIS — K8050 Calculus of bile duct without cholangitis or cholecystitis without obstruction: Secondary | ICD-10-CM

## 2014-08-08 HISTORY — PX: CHOLECYSTECTOMY: SHX55

## 2014-08-08 LAB — FECAL LACTOFERRIN, QUANT: FECAL LACTOFERRIN: POSITIVE

## 2014-08-08 LAB — GLUCOSE, CAPILLARY
GLUCOSE-CAPILLARY: 105 mg/dL — AB (ref 65–99)
GLUCOSE-CAPILLARY: 109 mg/dL — AB (ref 65–99)
GLUCOSE-CAPILLARY: 131 mg/dL — AB (ref 65–99)
GLUCOSE-CAPILLARY: 136 mg/dL — AB (ref 65–99)
GLUCOSE-CAPILLARY: 154 mg/dL — AB (ref 65–99)
Glucose-Capillary: 117 mg/dL — ABNORMAL HIGH (ref 65–99)
Glucose-Capillary: 125 mg/dL — ABNORMAL HIGH (ref 65–99)
Glucose-Capillary: 95 mg/dL (ref 65–99)
Glucose-Capillary: 157 mg/dL — ABNORMAL HIGH (ref 65–99)

## 2014-08-08 LAB — CBC
HCT: 35 % — ABNORMAL LOW (ref 36.0–46.0)
HEMOGLOBIN: 11.4 g/dL — AB (ref 12.0–15.0)
MCH: 27.9 pg (ref 26.0–34.0)
MCHC: 32.6 g/dL (ref 30.0–36.0)
MCV: 85.8 fL (ref 78.0–100.0)
PLATELETS: 230 10*3/uL (ref 150–400)
RBC: 4.08 MIL/uL (ref 3.87–5.11)
RDW: 14.9 % (ref 11.5–15.5)
WBC: 4.4 10*3/uL (ref 4.0–10.5)

## 2014-08-08 SURGERY — LAPAROSCOPIC CHOLECYSTECTOMY
Anesthesia: General

## 2014-08-08 MED ORDER — LIDOCAINE HCL (CARDIAC) 20 MG/ML IV SOLN
INTRAVENOUS | Status: DC | PRN
  Administered 2014-08-08: 50 mg via INTRAVENOUS

## 2014-08-08 MED ORDER — BUPIVACAINE HCL (PF) 0.5 % IJ SOLN
INTRAMUSCULAR | Status: AC
  Filled 2014-08-08: qty 30

## 2014-08-08 MED ORDER — ONDANSETRON HCL 4 MG PO TABS: 4.0000 mg | ORAL_TABLET | Freq: Four times a day (QID) | ORAL | Status: DC | PRN

## 2014-08-08 MED ORDER — ONDANSETRON HCL 4 MG/2ML IJ SOLN
4.0000 mg | Freq: Four times a day (QID) | INTRAMUSCULAR | Status: DC | PRN
Start: 2014-08-08 — End: 2014-08-09

## 2014-08-08 MED ORDER — FENTANYL CITRATE (PF) 100 MCG/2ML IJ SOLN
INTRAMUSCULAR | Status: DC | PRN
  Administered 2014-08-08: 50 ug via INTRAVENOUS
  Administered 2014-08-08: 100 ug via INTRAVENOUS
  Administered 2014-08-08 (×2): 50 ug via INTRAVENOUS

## 2014-08-08 MED ORDER — GLYCOPYRROLATE 0.2 MG/ML IJ SOLN
INTRAMUSCULAR | Status: AC
  Filled 2014-08-08: qty 1

## 2014-08-08 MED ORDER — CIPROFLOXACIN IN D5W 400 MG/200ML IV SOLN
INTRAVENOUS | Status: AC
  Filled 2014-08-08: qty 200

## 2014-08-08 MED ORDER — NEOSTIGMINE METHYLSULFATE 10 MG/10ML IV SOLN
INTRAVENOUS | Status: AC
  Filled 2014-08-08: qty 1

## 2014-08-08 MED ORDER — POVIDONE-IODINE 10 % EX OINT
TOPICAL_OINTMENT | CUTANEOUS | Status: AC
  Filled 2014-08-08: qty 1

## 2014-08-08 MED ORDER — HYDROCODONE-ACETAMINOPHEN 5-325 MG PO TABS
1.0000 | ORAL_TABLET | ORAL | Status: DC | PRN
  Administered 2014-08-08 – 2014-08-09 (×4): 2 via ORAL
  Filled 2014-08-08 (×4): qty 2

## 2014-08-08 MED ORDER — LABETALOL HCL 5 MG/ML IV SOLN
INTRAVENOUS | Status: DC | PRN
  Administered 2014-08-08 (×3): 5 mg via INTRAVENOUS

## 2014-08-08 MED ORDER — CIPROFLOXACIN IN D5W 400 MG/200ML IV SOLN
400.0000 mg | Freq: Two times a day (BID) | INTRAVENOUS | Status: DC
  Administered 2014-08-08 – 2014-08-09 (×2): 400 mg via INTRAVENOUS
  Filled 2014-08-08 (×2): qty 200

## 2014-08-08 MED ORDER — BUPIVACAINE HCL (PF) 0.5 % IJ SOLN
INTRAMUSCULAR | Status: DC | PRN
  Administered 2014-08-08: 10 mL

## 2014-08-08 MED ORDER — ETOMIDATE 2 MG/ML IV SOLN
INTRAVENOUS | Status: DC | PRN
  Administered 2014-08-08: 12 mg via INTRAVENOUS

## 2014-08-08 MED ORDER — ONDANSETRON HCL 4 MG/2ML IJ SOLN: 4.0000 mg | Freq: Once | INTRAMUSCULAR | Status: DC | PRN

## 2014-08-08 MED ORDER — FENTANYL CITRATE (PF) 100 MCG/2ML IJ SOLN
25.0000 ug | INTRAMUSCULAR | Status: AC | PRN
  Administered 2014-08-08 (×6): 50 ug via INTRAVENOUS
  Filled 2014-08-08: qty 2

## 2014-08-08 MED ORDER — GLYCOPYRROLATE 0.2 MG/ML IJ SOLN
INTRAMUSCULAR | Status: DC | PRN
  Administered 2014-08-08: 0.6 mg via INTRAVENOUS

## 2014-08-08 MED ORDER — BUPIVACAINE-EPINEPHRINE (PF) 0.5% -1:200000 IJ SOLN
INTRAMUSCULAR | Status: AC
  Filled 2014-08-08: qty 30

## 2014-08-08 MED ORDER — NEOSTIGMINE METHYLSULFATE 10 MG/10ML IV SOLN
INTRAVENOUS | Status: DC | PRN
  Administered 2014-08-08: 4 mg via INTRAVENOUS

## 2014-08-08 MED ORDER — ACETAMINOPHEN 325 MG PO TABS: 650.0000 mg | ORAL_TABLET | Freq: Four times a day (QID) | ORAL | Status: DC | PRN

## 2014-08-08 MED ORDER — HEMOSTATIC AGENTS (NO CHARGE) OPTIME
TOPICAL | Status: DC | PRN
  Administered 2014-08-08: 2 via TOPICAL

## 2014-08-08 MED ORDER — ENOXAPARIN SODIUM 40 MG/0.4ML ~~LOC~~ SOLN
40.0000 mg | SUBCUTANEOUS | Status: DC
  Administered 2014-08-09: 40 mg via SUBCUTANEOUS
  Filled 2014-08-08: qty 0.4

## 2014-08-08 MED ORDER — CHLORHEXIDINE GLUCONATE 4 % EX LIQD: 1.0000 "application " | Freq: Once | CUTANEOUS | Status: DC

## 2014-08-08 MED ORDER — KETOROLAC TROMETHAMINE 30 MG/ML IJ SOLN
30.0000 mg | Freq: Once | INTRAMUSCULAR | Status: AC
  Administered 2014-08-08: 30 mg via INTRAVENOUS
  Filled 2014-08-08: qty 1

## 2014-08-08 MED ORDER — ROCURONIUM BROMIDE 50 MG/5ML IV SOLN
INTRAVENOUS | Status: AC
  Filled 2014-08-08: qty 1

## 2014-08-08 MED ORDER — FENTANYL CITRATE (PF) 250 MCG/5ML IJ SOLN
INTRAMUSCULAR | Status: AC
  Filled 2014-08-08: qty 5

## 2014-08-08 MED ORDER — ONDANSETRON HCL 4 MG/2ML IJ SOLN
INTRAMUSCULAR | Status: AC
  Filled 2014-08-08: qty 2

## 2014-08-08 MED ORDER — LIDOCAINE HCL (PF) 1 % IJ SOLN
INTRAMUSCULAR | Status: AC
  Filled 2014-08-08: qty 5

## 2014-08-08 MED ORDER — SODIUM CHLORIDE 0.9 % IR SOLN
Status: DC | PRN
  Administered 2014-08-08: 1000 mL

## 2014-08-08 MED ORDER — MIDAZOLAM HCL 2 MG/2ML IJ SOLN
INTRAMUSCULAR | Status: AC
  Filled 2014-08-08: qty 2

## 2014-08-08 MED ORDER — ROCURONIUM BROMIDE 100 MG/10ML IV SOLN
INTRAVENOUS | Status: DC | PRN
  Administered 2014-08-08: 30 mg via INTRAVENOUS
  Administered 2014-08-08: 10 mg via INTRAVENOUS

## 2014-08-08 MED ORDER — ETOMIDATE 2 MG/ML IV SOLN
INTRAVENOUS | Status: AC
  Filled 2014-08-08: qty 10

## 2014-08-08 MED ORDER — LACTATED RINGERS IV SOLN
INTRAVENOUS | Status: DC
  Administered 2014-08-08: 13:00:00 via INTRAVENOUS

## 2014-08-08 MED ORDER — POVIDONE-IODINE 10 % OINT PACKET
TOPICAL_OINTMENT | CUTANEOUS | Status: DC | PRN
  Administered 2014-08-08: 1 via TOPICAL

## 2014-08-08 MED ORDER — GLYCOPYRROLATE 0.2 MG/ML IJ SOLN
INTRAMUSCULAR | Status: AC
  Filled 2014-08-08: qty 2

## 2014-08-08 MED ORDER — CIPROFLOXACIN IN D5W 400 MG/200ML IV SOLN
400.0000 mg | INTRAVENOUS | Status: AC
  Administered 2014-08-08: 400 mg via INTRAVENOUS

## 2014-08-08 MED ORDER — ONDANSETRON HCL 4 MG/2ML IJ SOLN
4.0000 mg | Freq: Once | INTRAMUSCULAR | Status: AC
  Administered 2014-08-08: 4 mg via INTRAVENOUS

## 2014-08-08 MED ORDER — MIDAZOLAM HCL 2 MG/2ML IJ SOLN
1.0000 mg | INTRAMUSCULAR | Status: DC | PRN
  Administered 2014-08-08: 2 mg via INTRAVENOUS

## 2014-08-08 MED ORDER — LACTATED RINGERS IV SOLN
INTRAVENOUS | Status: DC
  Administered 2014-08-08 (×2): via INTRAVENOUS

## 2014-08-08 MED ORDER — FENTANYL CITRATE (PF) 100 MCG/2ML IJ SOLN
INTRAMUSCULAR | Status: AC
  Filled 2014-08-08: qty 2

## 2014-08-08 SURGICAL SUPPLY — 44 items
APPLIER CLIP LAPSCP 10X32 DD (CLIP) ×2 IMPLANT
APPLIER CLIP ROT 10 11.4 M/L (STAPLE) ×4
BAG HAMPER (MISCELLANEOUS) ×2 IMPLANT
CHLORAPREP W/TINT 26ML (MISCELLANEOUS) ×2 IMPLANT
CLIP APPLIE ROT 10 11.4 M/L (STAPLE) ×2 IMPLANT
CLOTH BEACON ORANGE TIMEOUT ST (SAFETY) ×2 IMPLANT
COVER LIGHT HANDLE STERIS (MISCELLANEOUS) ×4 IMPLANT
DECANTER SPIKE VIAL GLASS SM (MISCELLANEOUS) ×2 IMPLANT
ELECT REM PT RETURN 9FT ADLT (ELECTROSURGICAL) ×2
ELECTRODE REM PT RTRN 9FT ADLT (ELECTROSURGICAL) ×1 IMPLANT
FILTER SMOKE EVAC LAPAROSHD (FILTER) ×2 IMPLANT
FORMALIN 10 PREFIL 120ML (MISCELLANEOUS) ×2 IMPLANT
GLOVE BIO SURGEON STRL SZ 6.5 (GLOVE) ×4 IMPLANT
GLOVE BIOGEL PI IND STRL 7.0 (GLOVE) ×1 IMPLANT
GLOVE BIOGEL PI IND STRL 7.5 (GLOVE) ×1 IMPLANT
GLOVE BIOGEL PI INDICATOR 7.0 (GLOVE) ×1
GLOVE BIOGEL PI INDICATOR 7.5 (GLOVE) ×1
GLOVE SURG SS PI 7.5 STRL IVOR (GLOVE) ×2 IMPLANT
GOWN STRL REUS W/ TWL XL LVL3 (GOWN DISPOSABLE) ×1 IMPLANT
GOWN STRL REUS W/TWL LRG LVL3 (GOWN DISPOSABLE) ×4 IMPLANT
GOWN STRL REUS W/TWL XL LVL3 (GOWN DISPOSABLE) ×1
HEMOSTAT SNOW SURGICEL 2X4 (HEMOSTASIS) ×4 IMPLANT
INST SET LAPROSCOPIC AP (KITS) ×2 IMPLANT
IV NS IRRIG 3000ML ARTHROMATIC (IV SOLUTION) IMPLANT
KIT ROOM TURNOVER APOR (KITS) ×2 IMPLANT
MANIFOLD NEPTUNE II (INSTRUMENTS) ×2 IMPLANT
NEEDLE INSUFFLATION 14GA 120MM (NEEDLE) ×2 IMPLANT
NS IRRIG 1000ML POUR BTL (IV SOLUTION) ×2 IMPLANT
PACK LAP CHOLE LZT030E (CUSTOM PROCEDURE TRAY) ×2 IMPLANT
PAD ARMBOARD 7.5X6 YLW CONV (MISCELLANEOUS) ×2 IMPLANT
POUCH SPECIMEN RETRIEVAL 10MM (ENDOMECHANICALS) ×2 IMPLANT
SET BASIN LINEN APH (SET/KITS/TRAYS/PACK) ×2 IMPLANT
SET TUBE IRRIG SUCTION NO TIP (IRRIGATION / IRRIGATOR) IMPLANT
SLEEVE ENDOPATH XCEL 5M (ENDOMECHANICALS) ×2 IMPLANT
SPONGE GAUZE 2X2 8PLY STRL LF (GAUZE/BANDAGES/DRESSINGS) ×8 IMPLANT
STAPLER VISISTAT (STAPLE) ×2 IMPLANT
SUT VICRYL 0 UR6 27IN ABS (SUTURE) ×2 IMPLANT
TAPE CLOTH SURG 4X10 WHT LF (GAUZE/BANDAGES/DRESSINGS) ×2 IMPLANT
TROCAR ENDO BLADELESS 11MM (ENDOMECHANICALS) ×2 IMPLANT
TROCAR XCEL NON-BLD 5MMX100MML (ENDOMECHANICALS) ×2 IMPLANT
TROCAR XCEL UNIV SLVE 11M 100M (ENDOMECHANICALS) ×2 IMPLANT
TUBING INSUFFLATION (TUBING) ×2 IMPLANT
WARMER LAPAROSCOPE (MISCELLANEOUS) ×2 IMPLANT
YANKAUER SUCT 12FT TUBE ARGYLE (SUCTIONS) ×2 IMPLANT

## 2014-08-08 NOTE — Transfer of Care (Signed)
Immediate Anesthesia Transfer of Care Note  Patient: Marissa Schneider  Procedure(s) Performed: Procedure(s): LAPAROSCOPIC CHOLECYSTECTOMY (N/A)  Patient Location: PACU  Anesthesia Type:General  Level of Consciousness: awake, alert , oriented and patient cooperative  Airway & Oxygen Therapy: Patient Spontanous Breathing and Patient connected to face mask oxygen  Post-op Assessment: Report given to RN and Post -op Vital signs reviewed and stable  Post vital signs: Reviewed and stable  Last Vitals:  Filed Vitals:   08/08/14 0911  BP: 125/61  Pulse: 64  Temp: 36.7 C  Resp: 20    Complications: No apparent anesthesia complications

## 2014-08-08 NOTE — Anesthesia Procedure Notes (Signed)
Procedure Name: Intubation Date/Time: 08/08/2014 9:53 AM Performed by: Andree Elk, Manasseh Pittsley A Pre-anesthesia Checklist: Patient identified, Patient being monitored, Timeout performed, Emergency Drugs available and Suction available Patient Re-evaluated:Patient Re-evaluated prior to inductionOxygen Delivery Method: Circle System Utilized Preoxygenation: Pre-oxygenation with 100% oxygen Intubation Type: IV induction Ventilation: Mask ventilation without difficulty Laryngoscope Size: 3 and Miller Grade View: Grade I Tube type: Oral Tube size: 7.0 mm Number of attempts: 1 Airway Equipment and Method: Stylet Placement Confirmation: ETT inserted through vocal cords under direct vision,  positive ETCO2 and breath sounds checked- equal and bilateral Secured at: 21 cm Tube secured with: Tape Dental Injury: Teeth and Oropharynx as per pre-operative assessment

## 2014-08-08 NOTE — Anesthesia Preprocedure Evaluation (Signed)
Anesthesia Evaluation  Patient identified by MRN, date of birth, ID band Patient awake    Reviewed: Allergy & Precautions, NPO status , Patient's Chart, lab work & pertinent test results  Airway Mallampati: II  TM Distance: >3 FB     Dental  (+) Edentulous Upper, Edentulous Lower   Pulmonary former smoker,  breath sounds clear to auscultation        Cardiovascular - angina+ CAD, + Past MI and + Cardiac Stents Rhythm:Regular Rate:Normal     Neuro/Psych    GI/Hepatic negative GI ROS,   Endo/Other  diabetes, Type 2, Insulin Dependent  Renal/GU      Musculoskeletal   Abdominal   Peds  Hematology   Anesthesia Other Findings   Reproductive/Obstetrics                             Anesthesia Physical Anesthesia Plan  ASA: III  Anesthesia Plan: General   Post-op Pain Management:    Induction: Intravenous  Airway Management Planned: Oral ETT  Additional Equipment:   Intra-op Plan:   Post-operative Plan: Extubation in OR  Informed Consent: I have reviewed the patients History and Physical, chart, labs and discussed the procedure including the risks, benefits and alternatives for the proposed anesthesia with the patient or authorized representative who has indicated his/her understanding and acceptance.     Plan Discussed with:   Anesthesia Plan Comments:         Anesthesia Quick Evaluation

## 2014-08-08 NOTE — Progress Notes (Signed)
PROGRESS NOTE  Marissa Schneider HEN:[id number] DOB: [birth year] DOA: 08/04/2014 PCP: Andres Shad, MD  Summary: 69 year old woman with history of coronary artery disease, diabetes presented to the emergency department with vomiting, diarrhea, abdominal pain, epigastric pain. CT of the abdomen and pelvis revealed cholelithiasis with one gallstone in the region of the gallbladder neck. She was admitted for biliary colic and elevated troponins. She was seen by general surgery with plans made for cholecystectomy. Troponins were trended, patient seen by cardiology and felt to have demand ischemia with low risk nuclear stress test. Cleared for surgery which was performed on 7/13.  Assessment/Plan: 1. Biliary colic with associated vomiting, diarrhea, abdominal pain. Stable, no fever, normal WBC.  Abdominal ultrasound with gallstones and mild gallbladder distention. No sonographic Murphy sign. See my general surgery and underwent cholecystectomy on 7/13. Continue to follow. 2. Demand ischemia. Low risk nuclear stress test. Cleared for surgery per cardiology. 3. Coronary artery disease, status post stent placement October 2014. Continue ASA. Stable. 4. Modest hyponatremia. Resolved. 5. Prolonged QT. Resolved. Thought to be related to demand ischemia. 6. Diabetes mellitus type 2, remains stable. Hemoglobin A1c 7.1. Blood sugar stable. 7. Tobacco dependence in remission.  Code Status: full code DVT prophylaxis: Lovenox Family Communication: none present Disposition Plan: home  Murray Hodgkins, MD  [facility]  Pager [phone] If 7PM-7AM, please contact night-coverage at [email or web address], password [facility] 08/08/2014, 7:35 PM  LOS: 4 days   Consultants: Gen. surgery   Procedures:  Echo Study Conclusions  - Left ventricle: The cavity size was normal. There was mild focal basal hypertrophy of the septum. Systolic function was normal. The estimated ejection fraction was in the range  of 60% to 65%. Wall motion was normal; there were no regional wall motion abnormalities. Doppler parameters are consistent with abnormal left ventricular relaxation (grade 1 diastolic dysfunction). Doppler parameters are consistent with high ventricular filling pressure. - Mitral valve: There was mild regurgitation.  Impressions:  - When compared to prior study, apical hypokinesis has resolved.  Antibiotics:    HPI/Subjective: Complaining of pain in her abdomen after surgery. No vomiting. Tolerating diet  Objective: Filed Vitals:   08/08/14 1258 08/08/14 1322 08/08/14 1412 08/08/14 1559  BP: 98/64 119/85 120/53 127/48  Pulse:  62 61 68  Temp:  98.7 F (37.1 C) 98.5 F (36.9 C) 98.7 F (37.1 C)  TempSrc:  Oral Oral Oral  Resp: 17 18 18 18   Height:      Weight:      SpO2: 98% 99% 100% 99%    Intake/Output Summary (Last 24 hours) at 08/08/14 1935 Last data filed at 08/08/14 1814  Gross per 24 hour  Intake   2030 ml  Output   1555 ml  Net    475 ml     Filed Weights   08/07/14 0607 08/08/14 0502 08/08/14 0911  Weight: 76.34 kg (168 lb 4.8 oz) 75.978 kg (167 lb 8 oz) 75.751 kg (167 lb)    Exam:     Afebrile, vital signs stable, no hypoxia General:  Appears comfortable, calm. Cardiovascular: Regular rate and rhythm, no murmur, rub or gallop.  Telemetry: Sinus rhythm, no arrhythmias  Respiratory: Clear to auscultation bilaterally, no wheezes, rales or rhonchi. Normal respiratory effort. Abdomen: soft, diffusely tender in abdomen Psychiatric: grossly normal mood and affect, speech fluent and appropriate  New data reviewed: CBC unremarkable.  Pertinent data since admission: EKG on admission sinus rhythm, borderline prolonged QT EKG 7/10 sinus rhythm. Prolonged QT. Compared to previous study,  QT is longer. Chest x-ray independently reviewed, no acute disease     Scheduled Meds: . atorvastatin  80 mg Oral q1800  . ciprofloxacin  400 mg  Intravenous Q12H  . enalapril  5 mg Oral BID  . [START ON 08/09/2014] enoxaparin (LOVENOX) injection  40 mg Subcutaneous Q24H  . insulin aspart  0-9 Units Subcutaneous TID WC  . metoprolol tartrate  25 mg Oral BID  . pantoprazole  40 mg Oral Daily  . sodium chloride  3 mL Intravenous Q12H   Continuous Infusions: . lactated ringers 100 mL/hr at 08/08/14 1320    Principal Problem:   Biliary colic Active Problems:   CAD (coronary artery disease)   Type II diabetes mellitus, uncontrolled   Abdominal pain   Hyponatremia   Nausea & vomiting   Elevated troponin   Prolonged QT interval   Preop cardiovascular exam   Demand ischemia   Abdominal pain, RUQ   Time spent 15 minutes

## 2014-08-08 NOTE — Progress Notes (Signed)
Tan purse laying beside pt in the bed.

## 2014-08-08 NOTE — Progress Notes (Signed)
In room. Tan purse laying beside pt in the bed. Dentures returned to pt.

## 2014-08-08 NOTE — Anesthesia Postprocedure Evaluation (Signed)
  Anesthesia Post-op Note  Patient: Marissa Schneider  Procedure(s) Performed: Procedure(s): LAPAROSCOPIC CHOLECYSTECTOMY (N/A)  Patient Location: PACU  Anesthesia Type:General  Level of Consciousness: awake, alert , oriented and patient cooperative  Airway and Oxygen Therapy: Patient Spontanous Breathing and Patient connected to nasal cannula oxygen  Post-op Pain: moderate  Post-op Assessment: Post-op Vital signs reviewed, Patient's Cardiovascular Status Stable, Respiratory Function Stable, Patent Airway, No signs of Nausea or vomiting and Pain level controlled              Post-op Vital Signs: Reviewed and stable  Last Vitals:  Filed Vitals:   08/08/14 1145  BP: 136/52  Pulse: 63  Temp:   Resp: 16    Complications: No apparent anesthesia complications

## 2014-08-08 NOTE — Op Note (Signed)
Patient:  Marissa Schneider  DOB:  [birth year]  MRN:  [id number]   Preop Diagnosis:  Cholecystitis, cholelithiasis  Postop Diagnosis:  Same, hydrops of gallbladder  Procedure:  Laparoscopic cholecystectomy  Surgeon:  Aviva Signs, M.D.  Anes:  Gen. endotracheal  Indications:  Patient is a 69 year old black female who presented to the hospital for worsening right upper quadrant abdominal pain. She was found on CAT scan to have cholecystitis with cholelithiasis. She underwent cardiology workup which cleared her for surgery. The risks and benefits of the procedure including bleeding, infection, hepatobiliary injury, the possibility of open procedure were fully explained to the patient, who gave informed consent.  Procedure note:  The patient is placed the supine position. After induction of general endotracheal anesthesia, the abdomen was prepped and draped using usual sterile technique with DuraPrep. Surgical site confirmation was performed.  A supraumbilical incision was made down to the fascia. A Veress needle into the abdominal cavity and confirmation of placement was done using the saline drop test. The abdomen was then insufflated to 16 mmHg pressure. An 11 mm trocar was introduced into the abdominal cavity under direct visualization without difficulty. The patient was placed in reverse Trendelenburg position and an additional 11 mm trocar was placed the epigastric region and 5 mm trochars were placed the right upper quadrant and right flank regions. The liver was inspected and noted to be within normal limits. The gallbladder was noted to be severely inflamed and distended. The gallbladder was decompressed at its fundus and hydrops of the gallbladder was found. The gallbladder was subsequently retracted in a dynamic fashion to expose the triangle of Calot. The cystic duct was first identified. Its junction to the infundibulum was fully identified. Endoclips were placed proximally and  distally on the cystic duct, and the cystic duct was divided. This was likewise done to the cystic artery. The gallbladder was freed away from the gallbladder fossa using Bovie electrocautery. The gallbladder was delivered through the epigastric trocar site using an Endo Catch bag. The gallbladder fossa was inspected no abnormal bleeding or bile leakage was noted. Surgicel is placed the gallbladder fossa. All fluid and air were then evacuated from the abdominal cavity prior to removal of the trochars.  All wounds were irrigated with normal saline. All wounds were injected with 0.5% Sensorcaine. The supraumbilical fascia as well as epigastric fascia reapproximated using 0 Vicryl interrupted sutures. All skin incisions were closed using staples. Betadine ointment and dry sterile dressings were applied.  All tape and needle counts were correct the procedure. Patient was extubated in the operating room and transferred to PACU in stable condition.  Complications:  None  EBL:  Minimal  Specimen:  Gallbladder

## 2014-08-09 ENCOUNTER — Encounter (HOSPITAL_COMMUNITY): Payer: Self-pay | Admitting: General Surgery

## 2014-08-09 LAB — BASIC METABOLIC PANEL
ANION GAP: 8 (ref 5–15)
BUN: 8 mg/dL (ref 6–20)
CALCIUM: 7.9 mg/dL — AB (ref 8.9–10.3)
CO2: 29 mmol/L (ref 22–32)
Chloride: 99 mmol/L — ABNORMAL LOW (ref 101–111)
Creatinine, Ser: 0.84 mg/dL (ref 0.44–1.00)
GFR calc Af Amer: >60 mL/min (ref >60)
GFR calc non Af Amer: >60 mL/min (ref >60)
Glucose, Bld: 132 mg/dL — ABNORMAL HIGH (ref 65–99)
POTASSIUM: 4 mmol/L (ref 3.5–5.1)
SODIUM: 136 mmol/L (ref 135–145)

## 2014-08-09 LAB — GLUCOSE, CAPILLARY
GLUCOSE-CAPILLARY: 141 mg/dL — AB (ref 65–99)
Glucose-Capillary: 116 mg/dL — ABNORMAL HIGH (ref 65–99)
Glucose-Capillary: 122 mg/dL — ABNORMAL HIGH (ref 65–99)
Glucose-Capillary: 109 mg/dL — ABNORMAL HIGH (ref 65–99)

## 2014-08-09 LAB — HEPATIC FUNCTION PANEL
ALT: 30 U/L (ref 14–54)
AST: 39 U/L (ref 15–41)
Albumin: 3 g/dL — ABNORMAL LOW (ref 3.5–5.0)
Alkaline Phosphatase: 65 U/L (ref 38–126)
Bilirubin, Direct: 0.2 mg/dL (ref 0.1–0.5)
Indirect Bilirubin: 0.9 mg/dL (ref 0.3–0.9)
TOTAL PROTEIN: 6.2 g/dL — AB (ref 6.5–8.1)
Total Bilirubin: 1.1 mg/dL (ref 0.3–1.2)

## 2014-08-09 LAB — CBC
HCT: 33.8 % — ABNORMAL LOW (ref 36.0–46.0)
Hemoglobin: 11 g/dL — ABNORMAL LOW (ref 12.0–15.0)
MCH: 27.7 pg (ref 26.0–34.0)
MCHC: 32.5 g/dL (ref 30.0–36.0)
MCV: 85.1 fL (ref 78.0–100.0)
Platelets: 226 10*3/uL (ref 150–400)
RBC: 3.97 MIL/uL (ref 3.87–5.11)
RDW: 14.8 % (ref 11.5–15.5)
WBC: 7.3 10*3/uL (ref 4.0–10.5)

## 2014-08-09 MED ORDER — OXYCODONE-ACETAMINOPHEN 7.5-325 MG PO TABS: 1.0000 | ORAL_TABLET | ORAL | Status: DC | PRN

## 2014-08-09 MED ORDER — CIPROFLOXACIN HCL 500 MG PO TABS: 500.0000 mg | ORAL_TABLET | Freq: Two times a day (BID) | ORAL | Status: DC

## 2014-08-09 NOTE — Discharge Instructions (Signed)

## 2014-08-09 NOTE — Addendum Note (Signed)
Addendum  created 08/09/14 1259 by Charmaine Downs, CRNA   Modules edited: Notes Section   Notes Section:  File: [id number]

## 2014-08-09 NOTE — Progress Notes (Signed)
Marissa Schneider discharged home with son per MD order.  Discharge instructions reviewed and discussed with the patient, all questions and concerns answered. Copy of instructions and scripts given to patient.    Medication List    TAKE these medications        acetaminophen 500 MG tablet  Commonly known as:  TYLENOL  Take 1,000 mg by mouth daily as needed for pain.     alum & mag hydroxide-simeth 200-200-20 MG/5ML suspension  Commonly known as:  MAALOX/MYLANTA  Take 15 mLs by mouth every 6 (six) hours as needed for indigestion or heartburn.     aspirin EC 81 MG tablet  Take 81 mg by mouth daily.     atorvastatin 80 MG tablet  Commonly known as:  LIPITOR  TAKE 1 TABLET (80 MG TOTAL) BY MOUTH DAILY AT 6 PM.     ciprofloxacin 500 MG tablet  Commonly known as:  CIPRO  Take 1 tablet (500 mg total) by mouth 2 (two) times daily.     diphenhydrAMINE 25 MG tablet  Commonly known as:  BENADRYL  Take 50 mg by mouth daily as needed for allergies (congestion).     enalapril 5 MG tablet  Commonly known as:  VASOTEC  Take 1 tablet (5 mg total) by mouth 2 (two) times daily.     KRILL OIL PO  Take 1 capsule by mouth daily.     metoprolol tartrate 25 MG tablet  Commonly known as:  LOPRESSOR  Take 1 tablet (25 mg total) by mouth 2 (two) times daily.     multivitamin with minerals Tabs tablet  Take 1 tablet by mouth daily.     nitroGLYCERIN 0.4 MG SL tablet  Commonly known as:  NITROSTAT  Place 1 tablet (0.4 mg total) under the tongue every 5 (five) minutes x 3 doses as needed for chest pain.     oxyCODONE-acetaminophen 7.5-325 MG per tablet  Commonly known as:  PERCOCET  Take 1-2 tablets by mouth every 4 (four) hours as needed.     POTASSIUM PO  Take 1 tablet by mouth daily. 99 mg daily     VITAMIN C PO  Take 1 tablet by mouth daily.         IV site discontinued and catheter remains intact. Site without signs and symptoms of complications. Dressing and pressure  applied.  Patient escorted to car by Lovena Le, RN in a wheelchair,  no distress noted upon discharge.  Regino Bellow 08/09/2014 5:09 PM

## 2014-08-09 NOTE — Care Management Note (Signed)
Case Management Note  Patient Details  Name: Marissa Schneider MRN: [id number] Date of Birth: [birth year]  Subjective/Objective:                    Action/Plan:   Expected Discharge Date:                  Expected Discharge Plan:  Home/Self Care  In-House Referral:  NA  Discharge planning Services  CM Consult  Post Acute Care Choice:  NA Choice offered to:  NA  DME Arranged:    DME Agency:     HH Arranged:    HH Agency:     Status of Service:  Completed, signed off  Medicare Important Message Given:  Yes-second notification given Date Medicare IM Given:    Medicare IM give by:    Date Additional Medicare IM Given:    Additional Medicare Important Message give by:     If discussed at [place] of Stay Meetings, dates discussed:    Additional Comments: Pt discharged home today. No CM needs noted. Christinia Gully [place], RN 08/09/2014, 8:26 AM

## 2014-08-09 NOTE — Discharge Summary (Signed)
Physician Discharge Summary  Patient ID: Marissa Schneider MRN: [id number] DOB/AGE: 71/22/1947 71 y.o.  Admit date: 08/04/2014 Discharge date: 08/09/2014  Admission Diagnoses: Cholecystitis, cholelithiasis, elevated troponins, coronary artery disease  Discharge Diagnoses: Same Principal Problem:   Biliary colic Active Problems:   CAD (coronary artery disease)   Type II diabetes mellitus, uncontrolled   Abdominal pain   Hyponatremia   Nausea & vomiting   Elevated troponin   Prolonged QT interval   Preop cardiovascular exam   Demand ischemia   Abdominal pain, RUQ   Discharged Condition: good  Hospital Course: He is a 69 year old black female who presented to the emergency room with worsening right upper quadrant abdominal pain. CT scan of the abdomen revealed cholecystitis with cholelithiasis. She was also noted to have elevated troponins. She denied any chest pain. 12-lead EKG was within normal limits. She was admitted to the hospital for further evaluation treatment. Her troponins did trend downward. She had a 2-D echo which was for the most part unremarkable. Cardiology was consulted and the stress test was performed. This was negative for ischemic disease. Her troponins did normalize. She stopped only underwent laparoscopic cholecystectomy on 08/08/2014. She tolerated the procedure well. A mild fever was noted which was felt to be secondary to her surgery as her gallbladder was inflamed. Her postoperative course has been unremarkable. Her diet was advanced without difficulty. The patient is being discharged home on 08/09/2014 in good improving condition.  Consults: cardiology and general surgery  Treatments: surgery: Laparoscopic cholecystectomy on 08/08/2014  Discharge Exam: Blood pressure 119/42, pulse 83, temperature 101.1 F (38.4 C), temperature source Oral, resp. rate 18, height 5\' 9"  (1.753 m), weight 79.062 kg (174 lb 4.8 oz), SpO2 91 %. General appearance: alert,  cooperative and no distress Resp: clear to auscultation bilaterally Cardio: regular rate and rhythm, S1, S2 normal, no murmur, click, rub or gallop GI: Soft, dressings dry and intact.  Disposition: 01-Home or Self Care     Medication List    TAKE these medications        acetaminophen 500 MG tablet  Commonly known as:  TYLENOL  Take 1,000 mg by mouth daily as needed for pain.     alum & mag hydroxide-simeth 200-200-20 MG/5ML suspension  Commonly known as:  MAALOX/MYLANTA  Take 15 mLs by mouth every 6 (six) hours as needed for indigestion or heartburn.     aspirin EC 81 MG tablet  Take 81 mg by mouth daily.     atorvastatin 80 MG tablet  Commonly known as:  LIPITOR  TAKE 1 TABLET (80 MG TOTAL) BY MOUTH DAILY AT 6 PM.     ciprofloxacin 500 MG tablet  Commonly known as:  CIPRO  Take 1 tablet (500 mg total) by mouth 2 (two) times daily.     diphenhydrAMINE 25 MG tablet  Commonly known as:  BENADRYL  Take 50 mg by mouth daily as needed for allergies (congestion).     enalapril 5 MG tablet  Commonly known as:  VASOTEC  Take 1 tablet (5 mg total) by mouth 2 (two) times daily.     KRILL OIL PO  Take 1 capsule by mouth daily.     metoprolol tartrate 25 MG tablet  Commonly known as:  LOPRESSOR  Take 1 tablet (25 mg total) by mouth 2 (two) times daily.     multivitamin with minerals Tabs tablet  Take 1 tablet by mouth daily.     nitroGLYCERIN 0.4 MG SL tablet  Commonly  known as:  NITROSTAT  Place 1 tablet (0.4 mg total) under the tongue every 5 (five) minutes x 3 doses as needed for chest pain.     oxyCODONE-acetaminophen 7.5-325 MG per tablet  Commonly known as:  PERCOCET  Take 1-2 tablets by mouth every 4 (four) hours as needed.     POTASSIUM PO  Take 1 tablet by mouth daily. 99 mg daily     VITAMIN C PO  Take 1 tablet by mouth daily.           Follow-up Information    Follow up with Jamesetta So, MD. Schedule an appointment as soon as possible for a  visit on 08/16/2014.   Specialty:  General Surgery   Contact information:   [address] [place] [address] [phone]       Signed: Aviva Signs A 08/09/2014, 8:08 AM

## 2014-08-09 NOTE — Anesthesia Postprocedure Evaluation (Signed)
  Anesthesia Post-op Note  Patient: Marissa Schneider  Procedure(s) Performed: Procedure(s): LAPAROSCOPIC CHOLECYSTECTOMY (N/A)  Patient Location: PACU  Anesthesia Type:General  Level of Consciousness: awake, alert , oriented and patient cooperative  Airway and Oxygen Therapy: Patient Spontanous Breathing  Post-op Pain: 2 /10, mild  Post-op Assessment: Post-op Vital signs reviewed, Patient's Cardiovascular Status Stable, Respiratory Function Stable and No signs of Nausea or vomiting              Post-op Vital Signs: Reviewed and stable  Last Vitals:  Filed Vitals:   08/09/14 0846  BP:   Pulse:   Temp: 37.3 C  Resp:     Complications: No apparent anesthesia complications

## 2014-08-11 LAB — STOOL CULTURE: SPECIAL REQUESTS: NORMAL

## 2014-12-13 ENCOUNTER — Other Ambulatory Visit: Payer: Self-pay | Admitting: Cardiology

## 2014-12-25 DIAGNOSIS — Z23 Encounter for immunization: Secondary | ICD-10-CM | POA: Diagnosis not present

## 2015-01-10 ENCOUNTER — Other Ambulatory Visit: Payer: Self-pay | Admitting: Cardiology

## 2015-04-12 ENCOUNTER — Other Ambulatory Visit: Payer: Self-pay | Admitting: Cardiology

## 2015-04-19 ENCOUNTER — Encounter: Payer: Self-pay | Admitting: Cardiology

## 2015-04-19 ENCOUNTER — Ambulatory Visit (INDEPENDENT_AMBULATORY_CARE_PROVIDER_SITE_OTHER): Payer: Medicare Other | Admitting: Cardiology

## 2015-04-19 VITALS — BP 112/64 | HR 65 | Ht 69.0 in | Wt 175.0 lb

## 2015-04-19 DIAGNOSIS — E119 Type 2 diabetes mellitus without complications: Secondary | ICD-10-CM | POA: Diagnosis not present

## 2015-04-19 DIAGNOSIS — Z79899 Other long term (current) drug therapy: Secondary | ICD-10-CM | POA: Diagnosis not present

## 2015-04-19 DIAGNOSIS — E785 Hyperlipidemia, unspecified: Secondary | ICD-10-CM | POA: Diagnosis not present

## 2015-04-19 DIAGNOSIS — I2510 Atherosclerotic heart disease of native coronary artery without angina pectoris: Secondary | ICD-10-CM | POA: Diagnosis not present

## 2015-04-19 MED ORDER — ATORVASTATIN CALCIUM 80 MG PO TABS: ORAL_TABLET | ORAL | Status: DC

## 2015-04-19 NOTE — Progress Notes (Signed)
Patient ID: Marissa Schneider, female   DOB: 1945/04/27, 70 y.o.   MRN: [id number]     Clinical Summary Marissa Schneider is a 70 y.o.female seen today for follow up of the following medical problems.   1. CAD  - patient admitted to [facility] 10/12-10/14/14 with anterior wall STEMI, LVEF by LV gram 45% with anterior wall hypokinesis. LVEF 55% by echo with apical hypokinesis - s/p DES to mid LAD  - 07/2014 nuclear stress without clear ischemia  - denies any chest pain. No SOB or DOE since last visit - compliant with meds.   2. HL  - ran out of statin a few weeks ago, reports she did not have any refills on her atorvastatin - no recent panel in our system.   3. DM2  - newly diagnosed with Hgb A1c of 7.3 during hospital admission  - she does not have regular pcp follow up  Past Medical History  Diagnosis Date  . CAD (coronary artery disease)     a. 10/2012 Ant STEMI/PCI: LM 20-30d, LAD 50/39m (2.5x24 Promus Premier DES), D1 50ost, LCX 50ost, OM1 50/50, RCa 43m/d, 50-70d @ bifurcation of PDA/PLA, EF 45%.  . Tobacco abuse   . Hyperlipidemia   . Myocardial infarction ([place])   . Type II diabetes mellitus, uncontrolled 11/08/2012  . Diabetes mellitus without complication      Allergies  Allergen Reactions  . Cheese Hives    On chin  . Aspirin Rash     Current Outpatient Prescriptions  Medication Sig Dispense Refill  . acetaminophen (TYLENOL) 500 MG tablet Take 1,000 mg by mouth daily as needed for pain.    Marland Kitchen alum & mag hydroxide-simeth (MAALOX/MYLANTA) 200-200-20 MG/5ML suspension Take 15 mLs by mouth every 6 (six) hours as needed for indigestion or heartburn.    . Ascorbic Acid (VITAMIN C PO) Take 1 tablet by mouth daily.    Marland Kitchen aspirin EC 81 MG tablet Take 81 mg by mouth daily.    Marland Kitchen atorvastatin (LIPITOR) 80 MG tablet TAKE 1 TABLET BY MOUTH EVERY DAY AT 6 PM 30 tablet 0  . ciprofloxacin (CIPRO) 500 MG tablet Take 1 tablet (500 mg total) by mouth 2 (two) times daily. 10 tablet  0  . diphenhydrAMINE (BENADRYL) 25 MG tablet Take 50 mg by mouth daily as needed for allergies (congestion).     . enalapril (VASOTEC) 5 MG tablet TAKE 1 TABLET(5 MG) BY MOUTH TWICE DAILY 60 tablet 6  . KRILL OIL PO Take 1 capsule by mouth daily.    . metoprolol tartrate (LOPRESSOR) 25 MG tablet TAKE 1 TABLET(25 MG) BY MOUTH TWICE DAILY 60 tablet 6  . Multiple Vitamin (MULTIVITAMIN WITH MINERALS) TABS tablet Take 1 tablet by mouth daily.    . nitroGLYCERIN (NITROSTAT) 0.4 MG SL tablet Place 1 tablet (0.4 mg total) under the tongue every 5 (five) minutes x 3 doses as needed for chest pain. 25 tablet 3  . oxyCODONE-acetaminophen (PERCOCET) 7.5-325 MG per tablet Take 1-2 tablets by mouth every 4 (four) hours as needed. 50 tablet 0  . pantoprazole (PROTONIX) 40 MG tablet TAKE 1 TABLET BY MOUTH EVERY DAY 30 tablet 6  . POTASSIUM PO Take 1 tablet by mouth daily. 99 mg daily     No current facility-administered medications for this visit.     Past Surgical History  Procedure Laterality Date  . Laparoscopy with tubal ligation    . Tubal ligation    . Coronary stent placement    .  Left heart catheterization with coronary angiogram N/A 11/06/2012    Procedure: LEFT HEART CATHETERIZATION WITH CORONARY ANGIOGRAM;  Surgeon: Blane Ohara, MD;  Location: [facility] CATH LAB;  Service: Cardiovascular;  Laterality: N/A;  . Appendectomy    . Cholecystectomy N/A 08/08/2014    Procedure: LAPAROSCOPIC CHOLECYSTECTOMY;  Surgeon: Aviva Signs, MD;  Location: [facility] ORS;  Service: General;  Laterality: N/A;     Allergies  Allergen Reactions  . Cheese Hives    On chin  . Aspirin Rash      Family History  Problem Relation Age of Onset  . Diabetes Maternal Aunt      Social History Marissa Schneider reports that she quit smoking about 2 years ago. Her smoking use included Cigarettes. She has a 50 pack-year smoking history. She has never used smokeless tobacco. Marissa Schneider reports that she drinks alcohol.   Review  of Systems CONSTITUTIONAL: No weight loss, fever, chills, weakness or fatigue.  HEENT: Eyes: No visual loss, blurred vision, double vision or yellow sclerae.No hearing loss, sneezing, congestion, runny nose or sore throat.  SKIN: No rash or itching.  CARDIOVASCULAR: per HPI RESPIRATORY: No shortness of breath, cough or sputum.  GASTROINTESTINAL: No anorexia, nausea, vomiting or diarrhea. No abdominal pain or blood.  GENITOURINARY: No burning on urination, no polyuria NEUROLOGICAL: No headache, dizziness, syncope, paralysis, ataxia, numbness or tingling in the extremities. No change in bowel or bladder control.  MUSCULOSKELETAL: No muscle, back pain, joint pain or stiffness.  LYMPHATICS: No enlarged nodes. No history of splenectomy.  PSYCHIATRIC: No history of depression or anxiety.  ENDOCRINOLOGIC: No reports of sweating, cold or heat intolerance. No polyuria or polydipsia.  Marland Kitchen   Physical Examination Filed Vitals:   04/19/15 0933  BP: 112/64  Pulse: 65   Filed Vitals:   04/19/15 0933  Height: 5\' 9"  (1.753 m)  Weight: 175 lb (79.379 kg)    Gen: resting comfortably, no acute distress HEENT: no scleral icterus, pupils equal round and reactive, no palptable cervical adenopathy,  CV: RRR, no m/r/g, no jvd Resp: Clear to auscultation bilaterally GI: abdomen is soft, non-tender, non-distended, normal bowel sounds, no hepatosplenomegaly MSK: extremities are warm, no edema.  Skin: warm, no rash Neuro:  no focal deficits Psych: appropriate affect   Diagnostic Studies 11/06/12 Cath  Cardiac Catheterization and Percutaneous Coronary Intervention 10.12.2014  Hemodynamics:  AO 175/88  LV 177/16  Coronary angiography:  Coronary dominance: right  Left mainstem: Patent vessel with 20-30% distal LM stenosis  Left anterior descending (LAD): Proximal irregularity. There is severe disease in the mid-LAD. Diffuse 50% stenosis leading into a 99% mid-vessel stenosis. The first diagonal  is large with 50% ostial stenosis and it originates at the proximal aspect of LAD plaque. The distal LAD is tortuous and widely patent as it wraps the LV apex.  **The mid-LAD was successfully stented using a 2.5x24 mm Promus Premier drug-eluting stent.**  Left circumflex (LCx): 50% ostial stenosis, 50% stenosis of OM1 with sequential lesions  Right coronary artery (RCA): normal caliber, dominant vessel. There is diffuse disease noted. There is 50% mid-vessel stenosis and 50-70% bifurcational disease at the PDA/PLA origin.  Left ventriculography: Left ventricular systolic function is moderately depressed. There is hypokinesis of the distal anterior wall and anteroapex. The LVEF is estimated at 45%.  Final Conclusions:  1. Severe mid-LAD stenosis treated successfully with primary PCI using a DES  2. Moderate RCA and LCx stenosis  3. Moderate LV dysfunction  11/07/12 Echo: LVEF 55%, mild LVH, diastolic  dysfunction without grade specified, hypokinesis apical anteroseptal and apical inferoseptal segments.  11/17/12 Clinic EKG  SR, normal axis, anteroseptal Q waves, diffuse T wave inversions    07/2014 Nuclear stress  There was no ST segment deviation noted during stress.  The study is normal.  This is a low risk study.  The left ventricular ejection fraction is mildly decreased (45-54%).  There is a small mild intensity distal anterior wall defect at rest. Postinjection a similar but slightly more intense defect is noted. This wall segment has normal wall motion. Findings likely secondary to differences in breast positioning/soft tissue attenuation. Less likely area of small ischemia. Low risk findings overall.    Assessment and Plan   1. CAD  - no current symptoms  - we will continue current medical therapy   2. HL  - continue high dose statin - we will repeat her lipid panel   3. DM2 - encouraged to establish with pcp - we will repeat her HgbA1c.   F/u 6  months   Arnoldo Lenis, M.D.

## 2015-04-19 NOTE — Patient Instructions (Signed)
Medication Instructions:  I REFILLED YOUR ATORVASTATIN   Labwork: Your physician recommends that you return for lab work in: ASAP CMET CBC TSH A1C MAGNESIUM LIPID   Testing/Procedures: NONE  Follow-Up: Your physician wants you to follow-up in: 6 MONTHS  You will receive a reminder letter in the mail two months in advance. If you don't receive a letter, please call our office to schedule the follow-up appointment.   Any Other Special Instructions Will Be Listed Below (If Applicable).     If you need a refill on your cardiac medications before your next appointment, please call your pharmacy.

## 2015-06-11 ENCOUNTER — Ambulatory Visit: Payer: Medicare Other | Admitting: Cardiology

## 2015-06-18 ENCOUNTER — Other Ambulatory Visit: Payer: Self-pay | Admitting: Cardiology

## 2015-10-21 ENCOUNTER — Other Ambulatory Visit: Payer: Self-pay | Admitting: Cardiology

## 2016-02-14 ENCOUNTER — Other Ambulatory Visit: Payer: Self-pay

## 2016-02-14 MED ORDER — PANTOPRAZOLE SODIUM 40 MG PO TBEC: 40.0000 mg | DELAYED_RELEASE_TABLET | Freq: Every day | ORAL | 0 refills | Status: DC

## 2016-02-14 MED ORDER — METOPROLOL TARTRATE 25 MG PO TABS: ORAL_TABLET | ORAL | 0 refills | Status: DC

## 2016-02-14 MED ORDER — ENALAPRIL MALEATE 5 MG PO TABS: ORAL_TABLET | ORAL | 0 refills | Status: DC

## 2016-02-14 NOTE — Telephone Encounter (Signed)
Overdue for fu, 1 month supply meds given,messaged front desk to schedule fu apt

## 2016-03-20 ENCOUNTER — Ambulatory Visit (INDEPENDENT_AMBULATORY_CARE_PROVIDER_SITE_OTHER): Payer: Medicare HMO | Admitting: Cardiology

## 2016-03-20 ENCOUNTER — Encounter: Payer: Self-pay | Admitting: Cardiology

## 2016-03-20 VITALS — BP 112/58 | HR 72 | Ht 70.0 in | Wt 173.0 lb

## 2016-03-20 DIAGNOSIS — E782 Mixed hyperlipidemia: Secondary | ICD-10-CM | POA: Diagnosis not present

## 2016-03-20 DIAGNOSIS — I2510 Atherosclerotic heart disease of native coronary artery without angina pectoris: Secondary | ICD-10-CM

## 2016-03-20 DIAGNOSIS — I10 Essential (primary) hypertension: Secondary | ICD-10-CM | POA: Diagnosis not present

## 2016-03-20 NOTE — Patient Instructions (Signed)
Your physician wants you to follow-up in: 1 year Dr Branch You will receive a reminder letter in the mail two months in advance. If you don't receive a letter, please call our office to schedule the follow-up appointment.    Your physician recommends that you continue on your current medications as directed. Please refer to the Current Medication list given to you today.    If you need a refill on your cardiac medications before your next appointment, please call your pharmacy.    Thank you for choosing [facility] !         

## 2016-03-20 NOTE — Progress Notes (Signed)
Clinical Summary Ms. Gajewski is a 71 y.o.female seen today for follow up of the following medical problems.   1. CAD  - patient admitted to [facility] 10/12-10/14/14 with anterior wall STEMI, LVEF by LV gram 45% with anterior wall hypokinesis. LVEF 55% by echo with apical hypokinesis - s/p DES to mid LAD  - 07/2014 nuclear stress without clear ischemia   - no recent chest pain. Occasional SOB/DOE with walking up steps which is stable - compliant with meds.   2. HL  - Jan 2018 TC 167 TG 127 HDL 45 LDL 97 - compliant with statin  3. DM2  - currently on metformin. Last HgbA1c was 7.2 in Jan 2018 - followed by pcp    Past Medical History:  Diagnosis Date  . CAD (coronary artery disease)    a. 10/2012 Ant STEMI/PCI: LM 20-30d, LAD 50/61m (2.5x24 Promus Premier DES), D1 50ost, LCX 50ost, OM1 50/50, RCa 48m/d, 50-70d @ bifurcation of PDA/PLA, EF 45%.  . Diabetes mellitus without complication ([place])   . Hyperlipidemia   . Myocardial infarction   . Tobacco abuse   . Type II diabetes mellitus, uncontrolled ([place]) 11/08/2012     Allergies  Allergen Reactions  . Cheese Hives    On chin  . Aspirin Rash     Current Outpatient Prescriptions  Medication Sig Dispense Refill  . acetaminophen (TYLENOL) 500 MG tablet Take 1,000 mg by mouth daily as needed for pain.    Marland Kitchen alum & mag hydroxide-simeth (MAALOX/MYLANTA) 200-200-20 MG/5ML suspension Take 15 mLs by mouth every 6 (six) hours as needed for indigestion or heartburn.    . Ascorbic Acid (VITAMIN C PO) Take 1 tablet by mouth daily.    Marland Kitchen aspirin EC 81 MG tablet Take 81 mg by mouth daily.    Marland Kitchen atorvastatin (LIPITOR) 80 MG tablet TAKE 1 TABLET BY MOUTH EVERY DAY AT 6 PM 30 tablet 11  . diphenhydrAMINE (BENADRYL) 25 MG tablet Take 50 mg by mouth daily as needed for allergies (congestion).     . enalapril (VASOTEC) 5 MG tablet TAKE 1 TABLET(5 MG) BY MOUTH TWICE DAILY 60 tablet 0  . KRILL OIL PO Take 1 capsule by mouth daily.     . metoprolol tartrate (LOPRESSOR) 25 MG tablet TAKE 1 TABLET(25 MG) BY MOUTH TWICE DAILY 60 tablet 0  . Multiple Vitamin (MULTIVITAMIN WITH MINERALS) TABS tablet Take 1 tablet by mouth daily.    . nitroGLYCERIN (NITROSTAT) 0.4 MG SL tablet DISSOLVE 1 TABLET UNDER TONGUE EVERY 5 MINUTES FOR 3 DOSES AS NEEDED FOR CHEST PAIN 25 tablet 3  . oxyCODONE-acetaminophen (PERCOCET) 7.5-325 MG per tablet Take 1-2 tablets by mouth every 4 (four) hours as needed. 50 tablet 0  . pantoprazole (PROTONIX) 40 MG tablet Take 1 tablet (40 mg total) by mouth daily. 30 tablet 0  . POTASSIUM PO Take 1 tablet by mouth daily. 99 mg daily     No current facility-administered medications for this visit.      Past Surgical History:  Procedure Laterality Date  . APPENDECTOMY    . CHOLECYSTECTOMY N/A 08/08/2014   Procedure: LAPAROSCOPIC CHOLECYSTECTOMY;  Surgeon: Aviva Signs, MD;  Location: [facility];  Service: General;  Laterality: N/A;  . CORONARY STENT PLACEMENT    . LAPAROSCOPY WITH TUBAL LIGATION    . LEFT HEART CATHETERIZATION WITH CORONARY ANGIOGRAM N/A 11/06/2012   Procedure: LEFT HEART CATHETERIZATION WITH CORONARY ANGIOGRAM;  Surgeon: Blane Ohara, MD;  Location: [facility] CATH LAB;  Service: Cardiovascular;  Laterality: N/A;  . TUBAL LIGATION       Allergies  Allergen Reactions  . Cheese Hives    On chin  . Aspirin Rash      Family History  Problem Relation Age of Onset  . Diabetes Maternal Aunt      Social History Ms. Schaff reports that she quit smoking about 3 years ago. Her smoking use included Cigarettes. She has a 50.00 pack-year smoking history. She has never used smokeless tobacco. Ms. Deline reports that she drinks alcohol.   Review of Systems CONSTITUTIONAL: No weight loss, fever, chills, weakness or fatigue.  HEENT: Eyes: No visual loss, blurred vision, double vision or yellow sclerae.No hearing loss, sneezing, congestion, runny nose or sore throat.  SKIN: No rash or itching.    CARDIOVASCULAR: per HPI RESPIRATORY: No shortness of breath, cough or sputum.  GASTROINTESTINAL: No anorexia, nausea, vomiting or diarrhea. No abdominal pain or blood.  GENITOURINARY: No burning on urination, no polyuria NEUROLOGICAL: No headache, dizziness, syncope, paralysis, ataxia, numbness or tingling in the extremities. No change in bowel or bladder control.  MUSCULOSKELETAL: No muscle, back pain, joint pain or stiffness.  LYMPHATICS: No enlarged nodes. No history of splenectomy.  PSYCHIATRIC: No history of depression or anxiety.  ENDOCRINOLOGIC: No reports of sweating, cold or heat intolerance. No polyuria or polydipsia.  Marland Kitchen   Physical Examination Vitals:   03/20/16 1002  BP: (!) 112/58  Pulse: 72   Vitals:   03/20/16 1002  Weight: 173 lb (78.5 kg)  Height: 5\' 10"  (1.778 m)    Gen: resting comfortably, no acute distress HEENT: no scleral icterus, pupils equal round and reactive, no palptable cervical adenopathy,  CV: RRR, no m/r/g, no jvd Resp: Clear to auscultation bilaterally GI: abdomen is soft, non-tender, non-distended, normal bowel sounds, no hepatosplenomegaly MSK: extremities are warm, no edema.  Skin: warm, no rash Neuro:  no focal deficits Psych: appropriate affect   Diagnostic Studies 11/06/12 Cath  Cardiac Catheterization and Percutaneous Coronary Intervention 10.12.2014  Hemodynamics:  AO 175/88  LV 177/16  Coronary angiography:  Coronary dominance: right  Left mainstem: Patent vessel with 20-30% distal LM stenosis  Left anterior descending (LAD): Proximal irregularity. There is severe disease in the mid-LAD. Diffuse 50% stenosis leading into a 99% mid-vessel stenosis. The first diagonal is large with 50% ostial stenosis and it originates at the proximal aspect of LAD plaque. The distal LAD is tortuous and widely patent as it wraps the LV apex.  **The mid-LAD was successfully stented using a 2.5x24 mm Promus Premier drug-eluting stent.**   Left circumflex (LCx): 50% ostial stenosis, 50% stenosis of OM1 with sequential lesions  Right coronary artery (RCA): normal caliber, dominant vessel. There is diffuse disease noted. There is 50% mid-vessel stenosis and 50-70% bifurcational disease at the PDA/PLA origin.  Left ventriculography: Left ventricular systolic function is moderately depressed. There is hypokinesis of the distal anterior wall and anteroapex. The LVEF is estimated at 45%.  Final Conclusions:  1. Severe mid-LAD stenosis treated successfully with primary PCI using a DES  2. Moderate RCA and LCx stenosis  3. Moderate LV dysfunction  11/07/12 Echo: LVEF 55%, mild LVH, diastolic dysfunction without grade specified, hypokinesis apical anteroseptal and apical inferoseptal segments.  11/17/12 Clinic EKG  SR, normal axis, anteroseptal Q waves, diffuse T wave inversions    07/2014 Nuclear stress  There was no ST segment deviation noted during stress.  The study is normal.  This is a low risk study.  The left ventricular ejection fraction is mildly decreased (45-54%).  There is a small mild intensity distal anterior wall defect at rest. Postinjection a similar but slightly more intense defect is noted. This wall segment has normal wall motion. Findings likely secondary to differences in breast positioning/soft tissue attenuation. Less likely area of small ischemia. Low risk findings overall.     Assessment and Plan  1. CAD  - no current symptoms. EKG in clinic shows SR, no acute ischemic changes -  continue current meds  2. HL  - continue high dose statin in setting of CD  3. DM2 - per pcp   F/u 6 months      Arnoldo Lenis, M.D.

## 2016-03-30 ENCOUNTER — Other Ambulatory Visit: Payer: Self-pay | Admitting: Cardiology

## 2016-03-30 MED ORDER — ATORVASTATIN CALCIUM 80 MG PO TABS: ORAL_TABLET | ORAL | 6 refills | Status: DC

## 2016-03-30 MED ORDER — ENALAPRIL MALEATE 5 MG PO TABS: ORAL_TABLET | ORAL | 6 refills | Status: DC

## 2016-03-30 MED ORDER — METOPROLOL TARTRATE 25 MG PO TABS: ORAL_TABLET | ORAL | 6 refills | Status: DC

## 2016-03-30 NOTE — Telephone Encounter (Signed)
Pt is needing her Rx's sent to [facility] in [place] on [address]

## 2016-03-30 NOTE — Telephone Encounter (Signed)
Pt told reception all her meds needed to be refilled, e-scribed all cardiology drugs

## 2016-04-03 ENCOUNTER — Other Ambulatory Visit: Payer: Self-pay

## 2016-04-03 MED ORDER — PANTOPRAZOLE SODIUM 40 MG PO TBEC: 40.0000 mg | DELAYED_RELEASE_TABLET | Freq: Every day | ORAL | 3 refills | Status: DC

## 2016-04-03 NOTE — Telephone Encounter (Signed)
escribed pantoprazole to pharmacy

## 2016-04-13 ENCOUNTER — Other Ambulatory Visit: Payer: Self-pay

## 2016-04-13 MED ORDER — ENALAPRIL MALEATE 5 MG PO TABS: ORAL_TABLET | ORAL | 3 refills | Status: DC

## 2016-04-13 MED ORDER — ATORVASTATIN CALCIUM 80 MG PO TABS: ORAL_TABLET | ORAL | 3 refills | Status: DC

## 2016-04-13 NOTE — Telephone Encounter (Signed)
Lipitor and Vasotec refilled per fax request

## 2016-08-21 IMAGING — CT CT ABD-PELV W/ CM
2 of 5 series · 16 of 46 positions shown, 18 images · IV contrast (Omnipaque 300)
Comparison: None.

CLINICAL DATA: 68-year-old female with acute abdominal and pelvic
pain with vomiting today.

EXAM:
CT ABDOMEN AND PELVIS WITH CONTRAST
TECHNIQUE: Multidetector CT imaging of the abdomen and pelvis was performed
using the standard protocol following bolus administration of
intravenous contrast.
CONTRAST:  100mL OMNIPAQUE IOHEXOL 300 MG/ML  SOLN

[Series 2: abd_pel_with 5.0 b40f · axial · 0.79mm/px · z∈[-490,-84]mm · 13 of 93 slices shown, 15 images]
[im 6/93  soft-tissue]
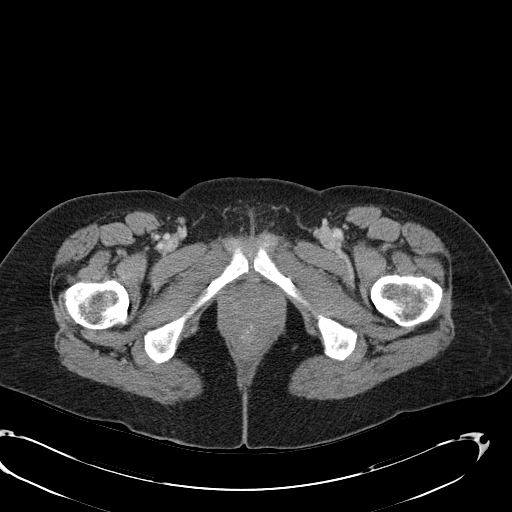
[im 6/93  bone]
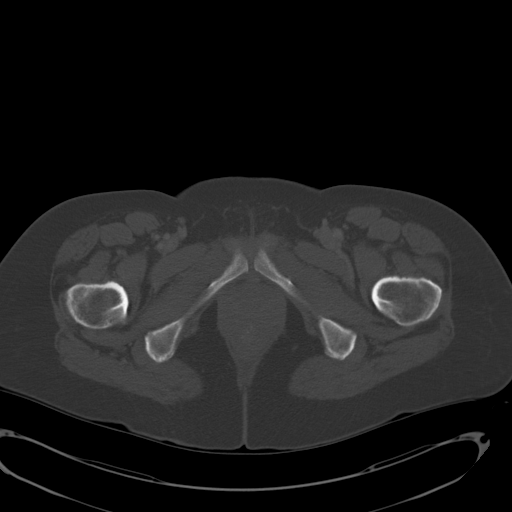
[im 11/93  soft-tissue]
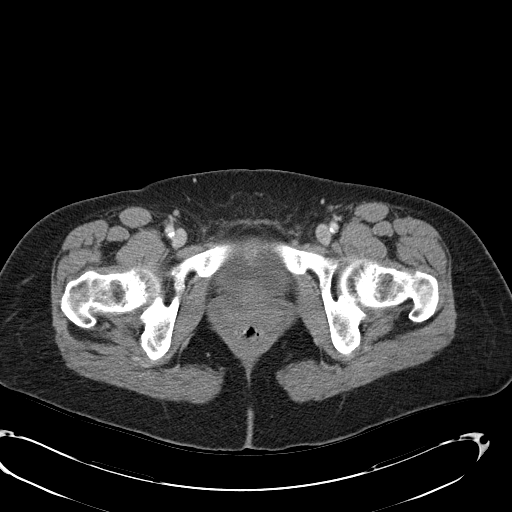
[im 22/93  soft-tissue]
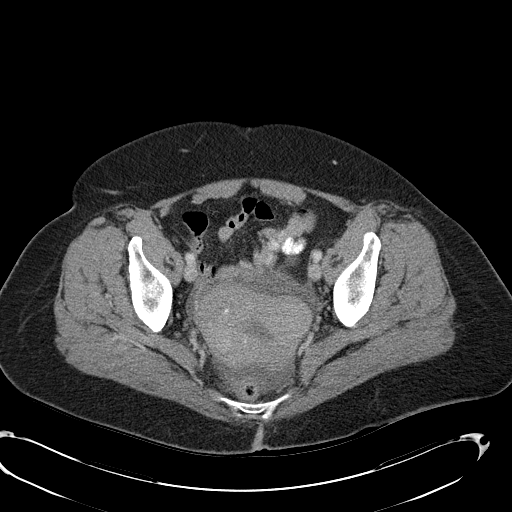
[im 28/93  soft-tissue]
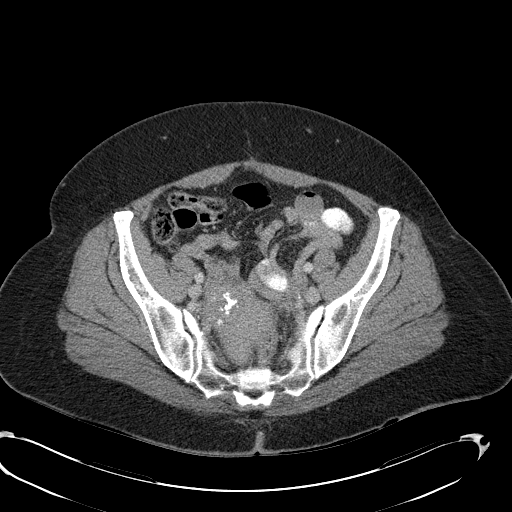
[im 33/93  soft-tissue]
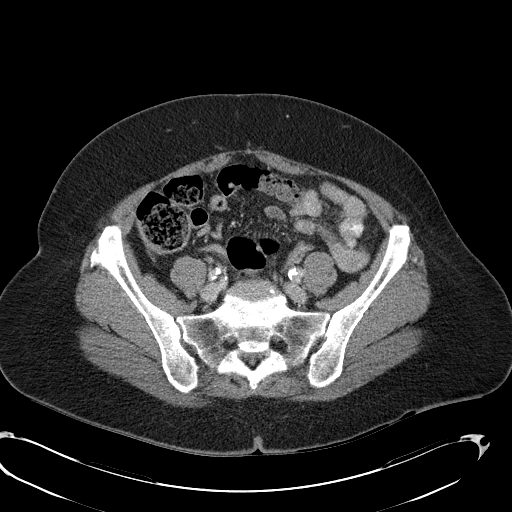
[im 38/93  soft-tissue]
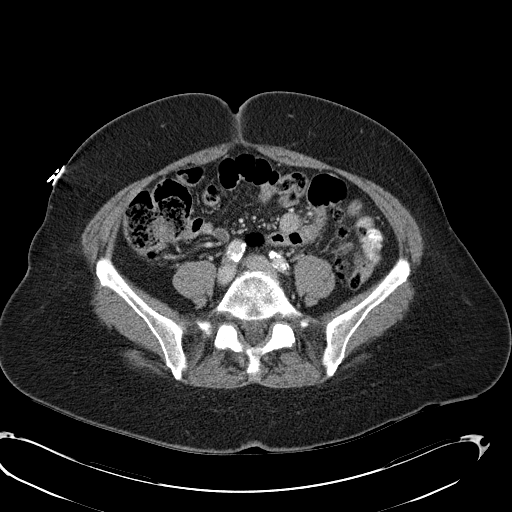
[im 49/93  soft-tissue]
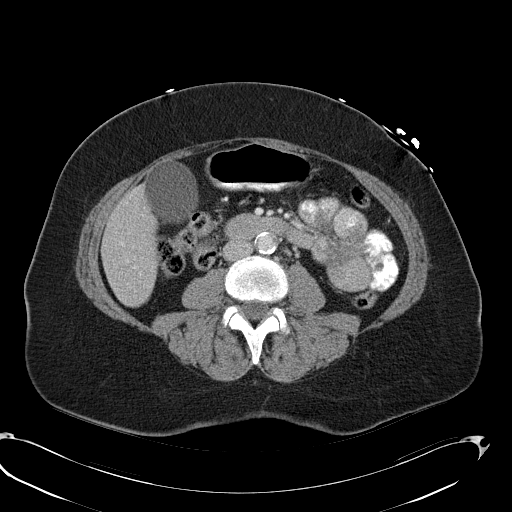
[im 55/93  soft-tissue]
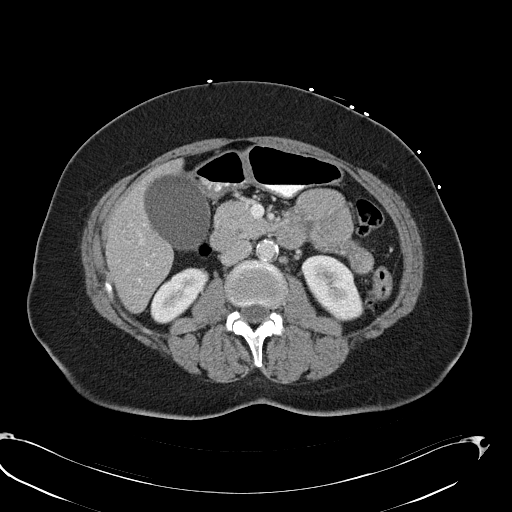
[im 60/93  soft-tissue]
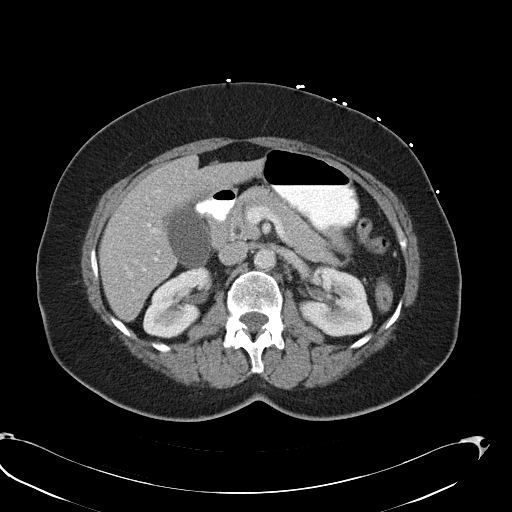
[im 60/93  bone]
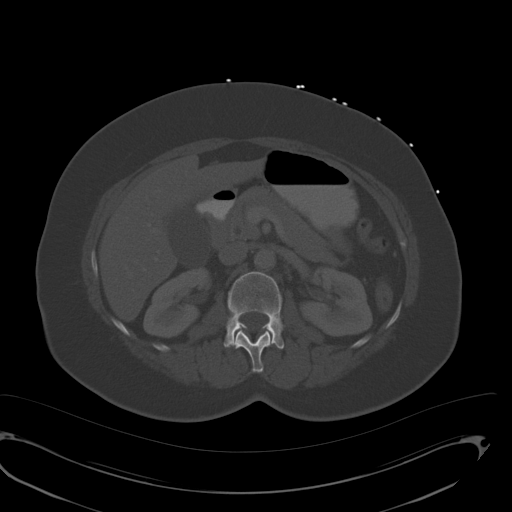
[im 65/93  soft-tissue]
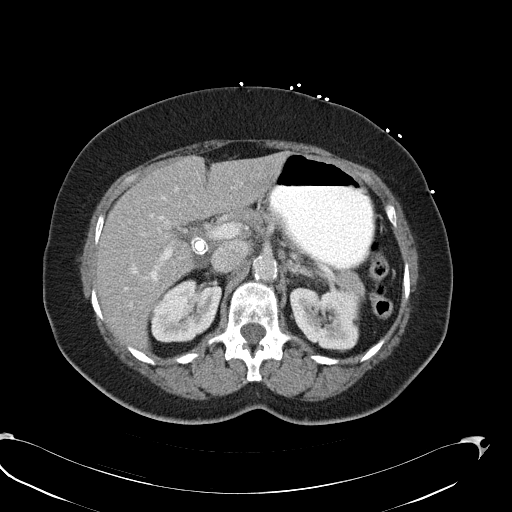
[im 71/93  soft-tissue]
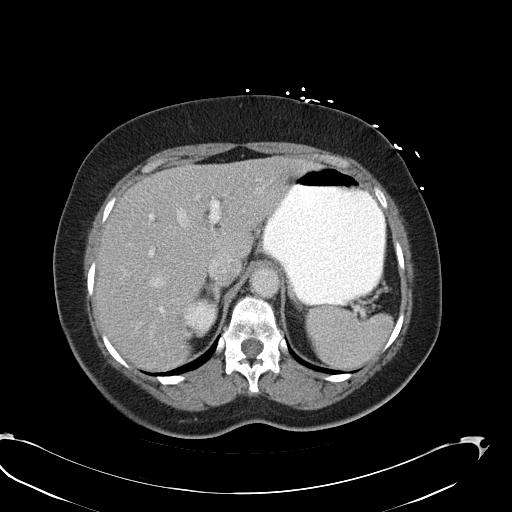
[im 82/93  soft-tissue]
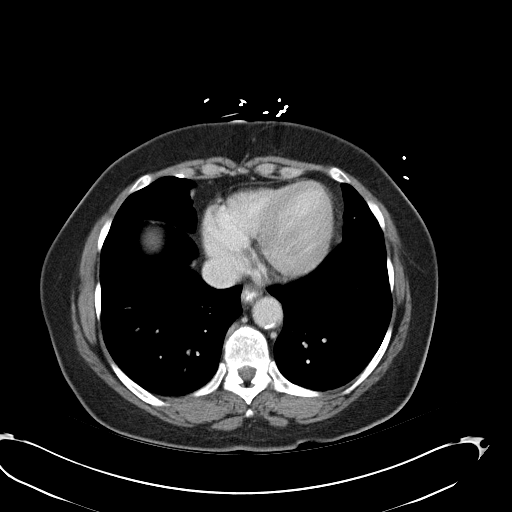
[im 87/93  soft-tissue]
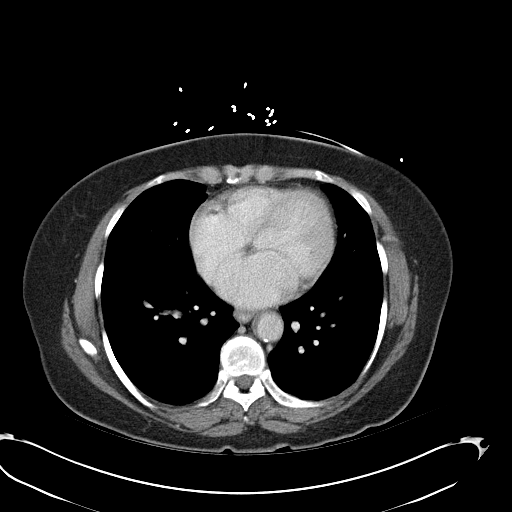

[Series 3: abd_pel_with 3.0 spo cor · coronal · 0.70mm/px · 3 of 93 slices shown]
[im 31/93  soft-tissue]
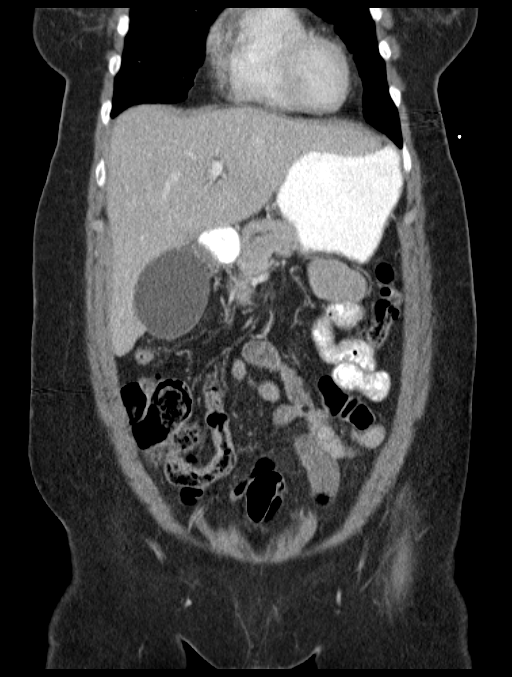
[im 41/93  soft-tissue]
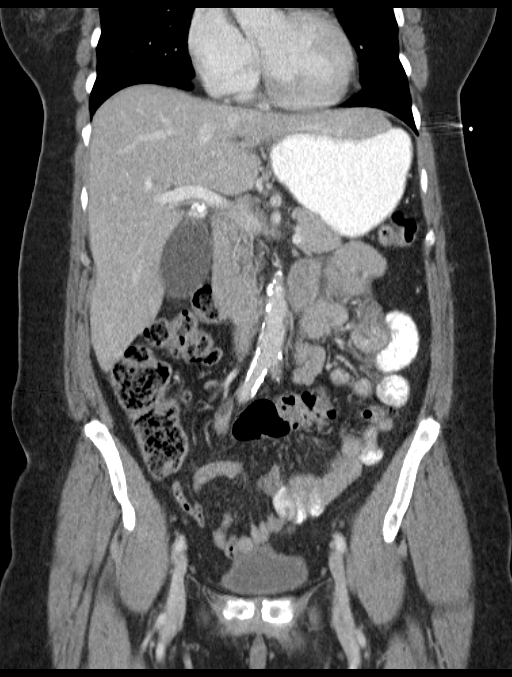
[im 52/93  soft-tissue]
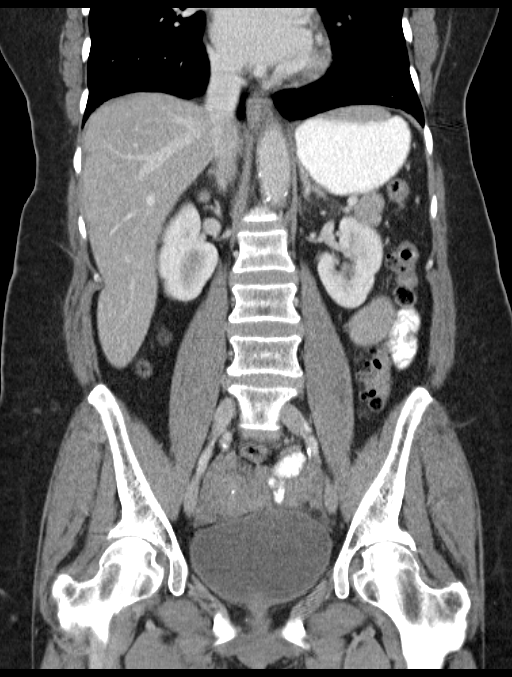

[16 of 46 positions shown; findings below may reference images not displayed]

FINDINGS: Please note that parenchymal abnormalities may be missed without
intravenous contrast.

Lower chest:  Minimal basilar scarring noted.

Hepatobiliary: The liver is unremarkable. Cholelithiasis identified
with mild distension of the gallbladder. One of the gallstones lies
in the region of the gallbladder neck. There is no evidence of
biliary dilatation.

Pancreas: Unremarkable

Spleen: Unremarkable

Adrenals/Urinary Tract: The kidneys, adrenal glands and bladder are
unremarkable.

Stomach/Bowel: There is no evidence of bowel obstruction or definite
bowel wall thickening.

Vascular/Lymphatic: No enlarged lymph nodes or abdominal aortic
aneurysm. Aortic atherosclerotic calcifications are noted.

Reproductive: Multiple uterine fibroids are identified. The adnexal
regions are unremarkable.

Other: Trace amount of free pelvic fluid is noted. There is no
evidence of abscess or pneumoperitoneum.

Musculoskeletal: No acute or suspicious abnormalities identified.
IMPRESSION: Cholelithiasis and distended gallbladder with 1 gallstone in the
region of the gallbladder neck. Consider abdominal ultrasound or
nuclear medicine study if there is strong clinical suspicion for
acute cholecystitis. No evidence of biliary dilatation.

Trace amount of free pelvic fluid of uncertain significance.

## 2016-08-21 IMAGING — DX DG CHEST 2V
2 series · 2 of 2 positions shown · non-contrast
Comparison: November 06, 2012.

CLINICAL DATA: Nausea, vomiting.

EXAM:
CHEST  2 VIEW

[chest pa]
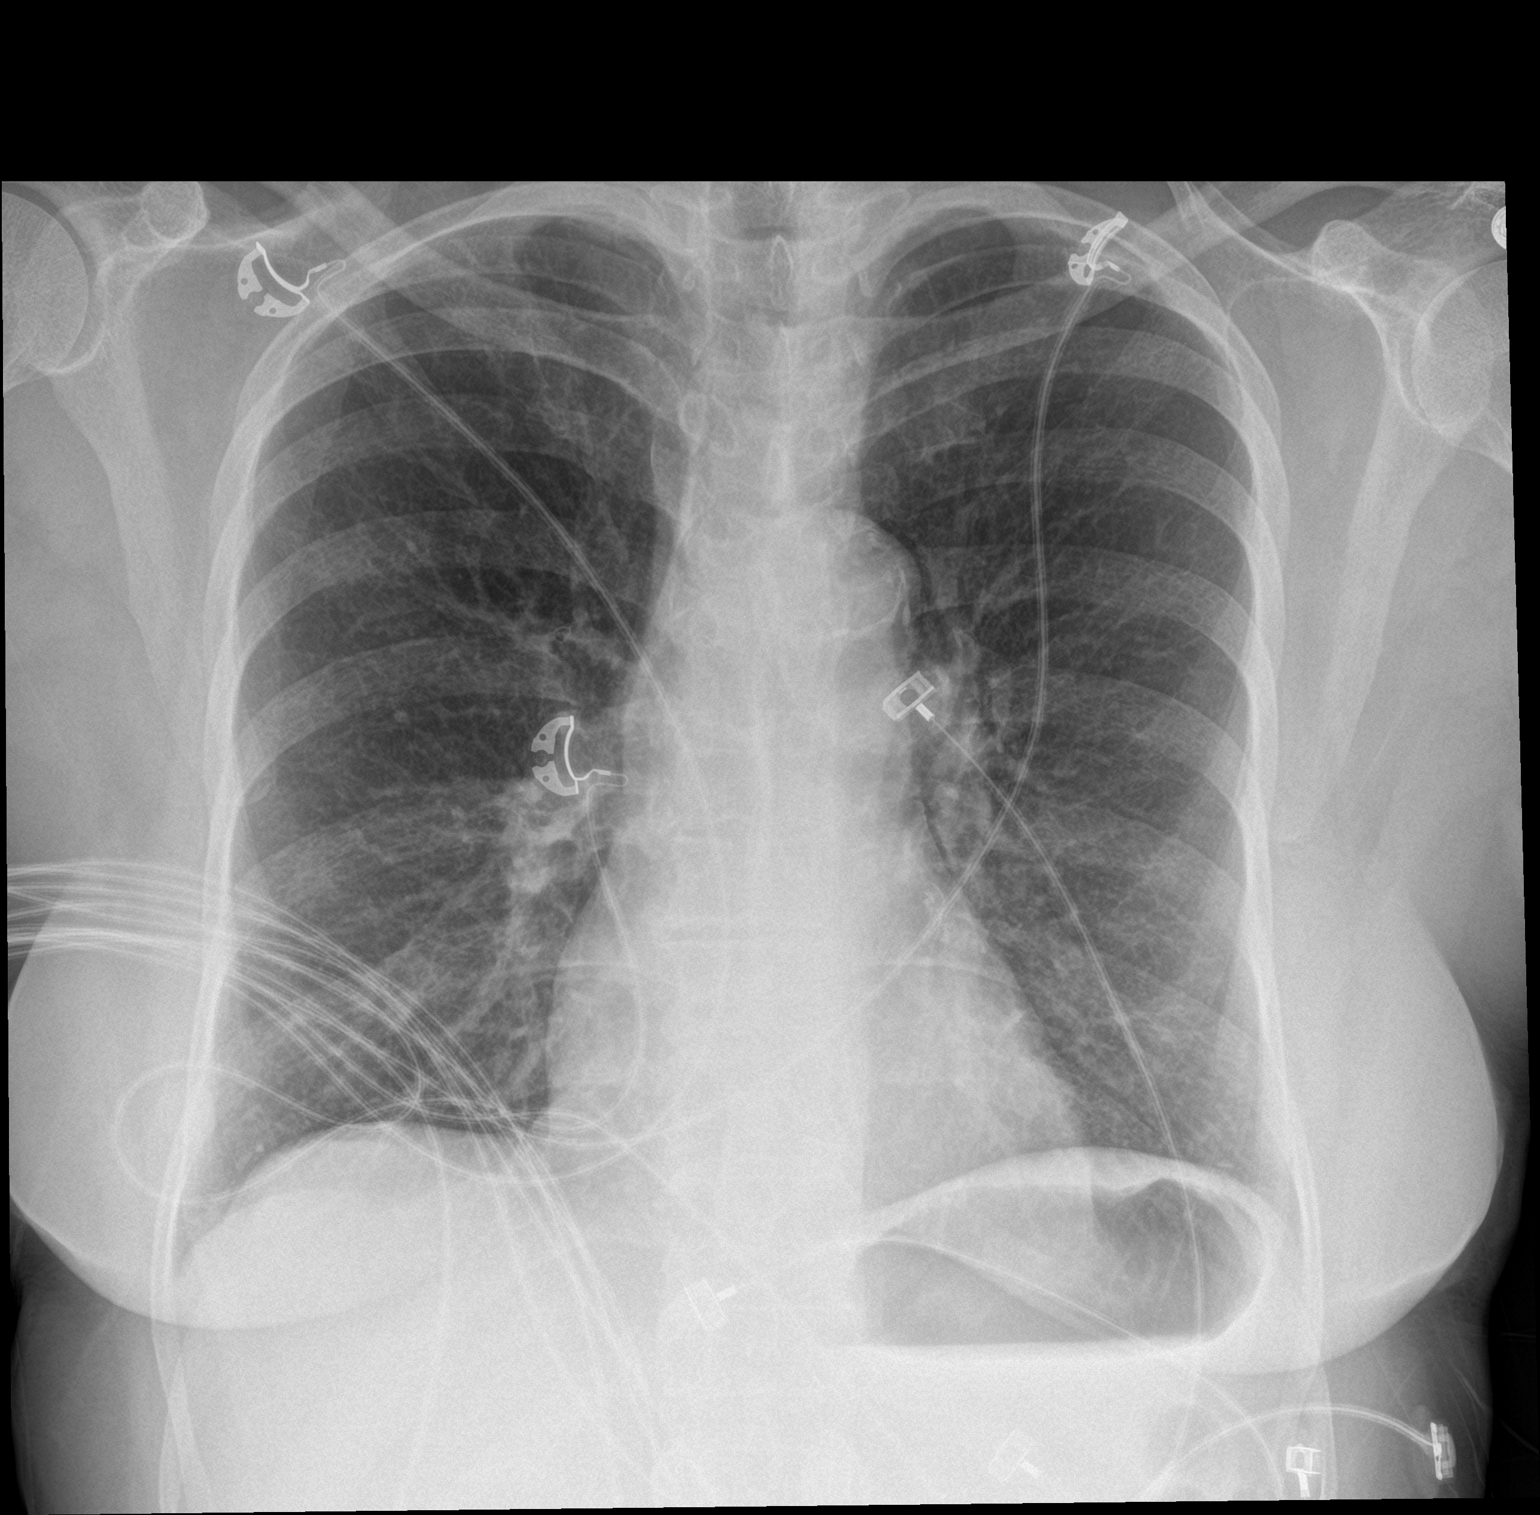

[chest lat]
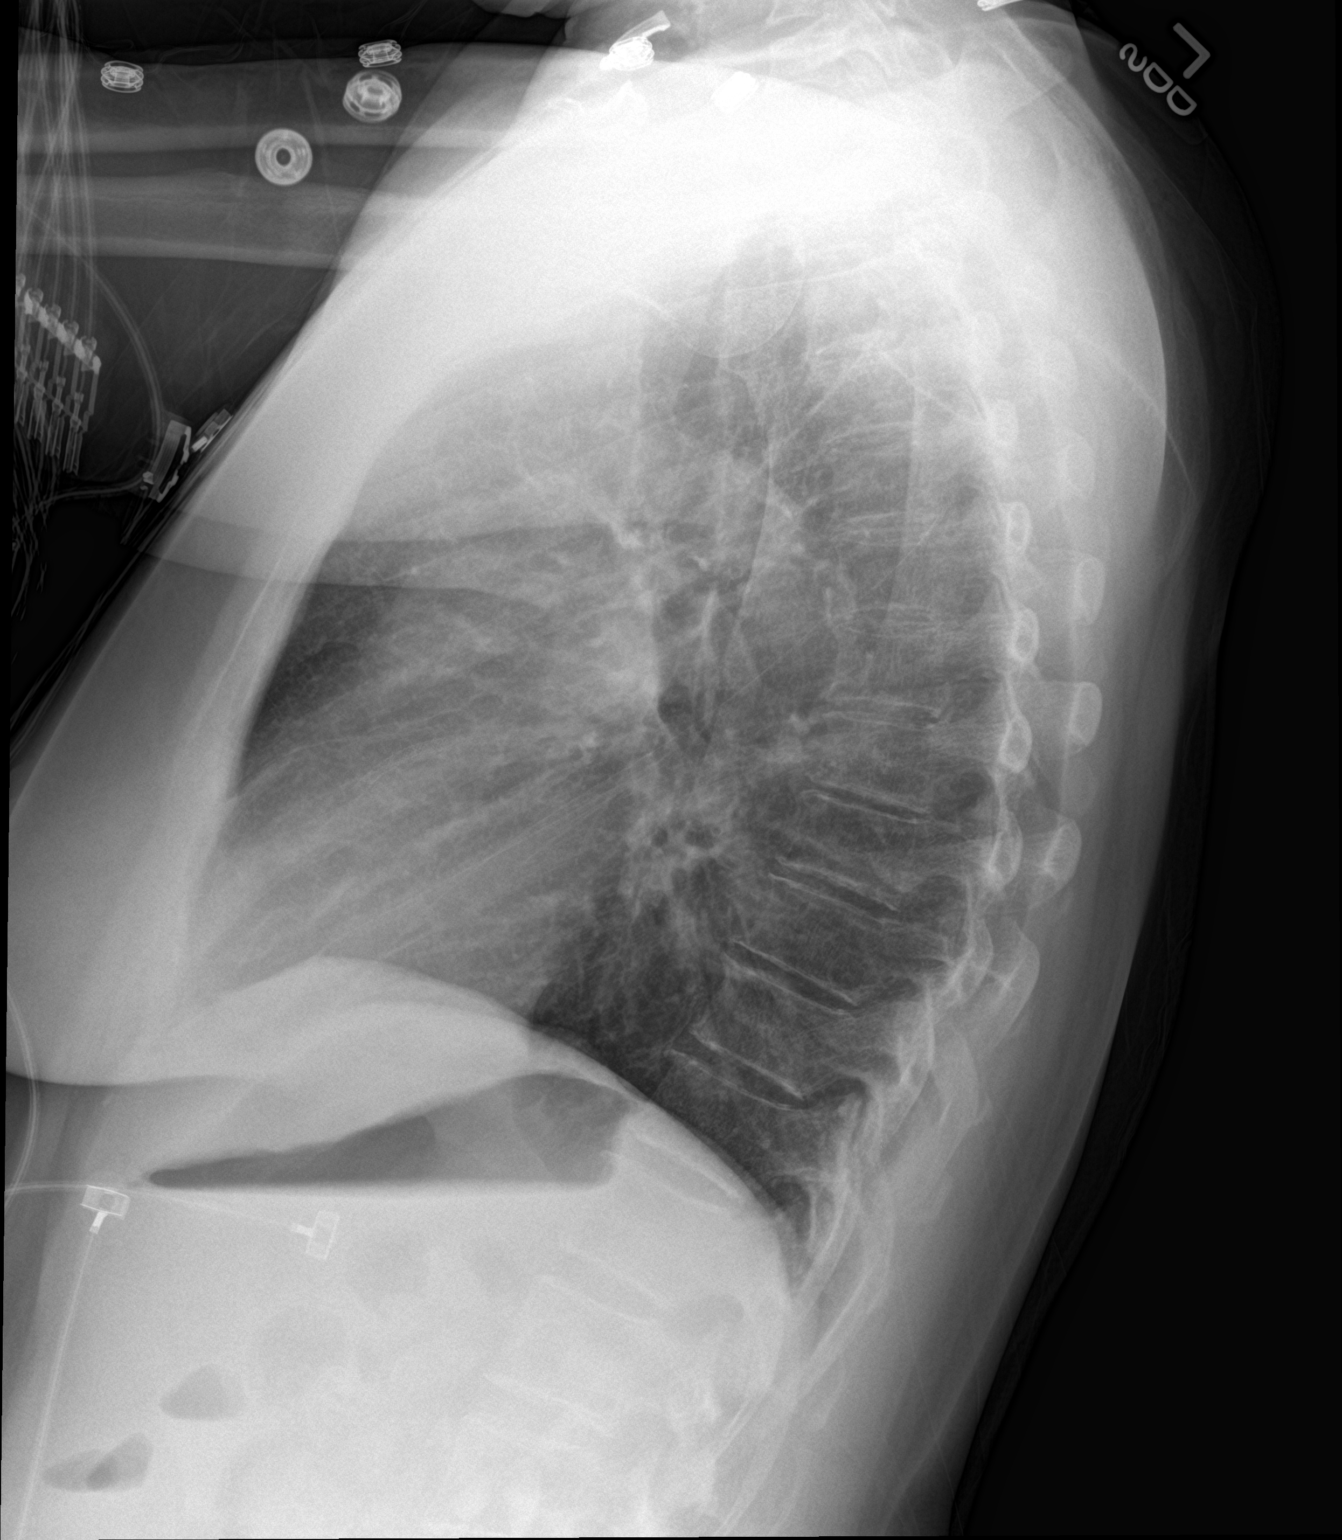

[2 of 2 positions shown; findings below may reference images not displayed]

FINDINGS: The heart size and mediastinal contours are within normal limits.
Both lungs are clear. No pneumothorax or pleural effusion is noted.
The visualized skeletal structures are unremarkable.
IMPRESSION: No active cardiopulmonary disease.

## 2016-09-25 ENCOUNTER — Other Ambulatory Visit: Payer: Self-pay | Admitting: *Deleted

## 2016-09-25 MED ORDER — METOPROLOL TARTRATE 25 MG PO TABS: ORAL_TABLET | ORAL | 3 refills | Status: DC

## 2016-09-25 MED ORDER — PANTOPRAZOLE SODIUM 40 MG PO TBEC: 40.0000 mg | DELAYED_RELEASE_TABLET | Freq: Every day | ORAL | 3 refills | Status: DC

## 2017-05-18 ENCOUNTER — Encounter: Payer: Self-pay | Admitting: *Deleted

## 2017-05-18 ENCOUNTER — Ambulatory Visit: Payer: Medicare Other | Admitting: Cardiology

## 2017-05-18 ENCOUNTER — Ambulatory Visit: Payer: Medicare HMO | Admitting: Cardiology

## 2017-05-18 ENCOUNTER — Encounter: Payer: Self-pay | Admitting: Cardiology

## 2017-05-18 VITALS — BP 146/64 | HR 77 | Ht 70.0 in | Wt 167.0 lb

## 2017-05-18 DIAGNOSIS — E782 Mixed hyperlipidemia: Secondary | ICD-10-CM

## 2017-05-18 DIAGNOSIS — I2510 Atherosclerotic heart disease of native coronary artery without angina pectoris: Secondary | ICD-10-CM

## 2017-05-18 DIAGNOSIS — E118 Type 2 diabetes mellitus with unspecified complications: Secondary | ICD-10-CM

## 2017-05-18 MED ORDER — NITROGLYCERIN 0.4 MG SL SUBL: SUBLINGUAL_TABLET | SUBLINGUAL | 3 refills | Status: DC

## 2017-05-18 NOTE — Progress Notes (Signed)
Clinical Summary Ms. Mathison is a 72 y.o.female seen today for follow up of the following medical problems.   1. CAD  - patient admitted to [facility] 10/12-10/14/14 with anterior wall STEMI, LVEF by LV gram 45% with anterior wall hypokinesis. LVEF 55% by echo with apical hypokinesis - s/p DES to mid LAD  - 07/2014 nuclear stress without clear ischemia   - mild nonspecific symptoms, mainly after eating certain foods - no exertional symptoms. Cuts grass regularly with push mower x 30-45 minutes without troubles.  - compliant with meds.    2. HL  - Jan 2018 TC 167 TG 127 HDL 45 LDL 97 - she is compliant with statin   3. DM2  - followed by pcp       Past Medical History:  Diagnosis Date  . CAD (coronary artery disease)    a. 10/2012 Ant STEMI/PCI: LM 20-30d, LAD 50/5m (2.5x24 Promus Premier DES), D1 50ost, LCX 50ost, OM1 50/50, RCa 50m/d, 50-70d @ bifurcation of PDA/PLA, EF 45%.  . Diabetes mellitus without complication ([place])   . Hyperlipidemia   . Myocardial infarction   . Tobacco abuse   . Type II diabetes mellitus, uncontrolled ([place]) 11/08/2012     Allergies  Allergen Reactions  . Cheese Hives    On chin  . Aspirin Rash     Current Outpatient Medications  Medication Sig Dispense Refill  . acetaminophen (TYLENOL) 500 MG tablet Take 1,000 mg by mouth daily as needed for pain.    Marland Kitchen alum & mag hydroxide-simeth (MAALOX/MYLANTA) 200-200-20 MG/5ML suspension Take 15 mLs by mouth every 6 (six) hours as needed for indigestion or heartburn.    . Ascorbic Acid (VITAMIN C PO) Take 1 tablet by mouth daily.    Marland Kitchen aspirin EC 81 MG tablet Take 81 mg by mouth daily.    Marland Kitchen atorvastatin (LIPITOR) 80 MG tablet TAKE 1 TABLET BY MOUTH EVERY DAY AT 6 PM 90 tablet 3  . diphenhydrAMINE (BENADRYL) 25 MG tablet Take 50 mg by mouth daily as needed for allergies (congestion).     . enalapril (VASOTEC) 5 MG tablet TAKE 1 TABLET(5 MG) BY MOUTH TWICE DAILY 180 tablet 3  . KRILL  OIL PO Take 1 capsule by mouth daily.    . metFORMIN (GLUCOPHAGE) 500 MG tablet Take by mouth 2 (two) times daily with a meal.    . metoprolol tartrate (LOPRESSOR) 25 MG tablet TAKE 1 TABLET(25 MG) BY MOUTH TWICE DAILY 180 tablet 3  . Multiple Vitamin (MULTIVITAMIN WITH MINERALS) TABS tablet Take 1 tablet by mouth daily.    . nitroGLYCERIN (NITROSTAT) 0.4 MG SL tablet DISSOLVE 1 TABLET UNDER TONGUE EVERY 5 MINUTES FOR 3 DOSES AS NEEDED FOR CHEST PAIN 25 tablet 3  . oxyCODONE-acetaminophen (PERCOCET) 7.5-325 MG per tablet Take 1-2 tablets by mouth every 4 (four) hours as needed. 50 tablet 0  . pantoprazole (PROTONIX) 40 MG tablet Take 1 tablet (40 mg total) by mouth daily. 90 tablet 3  . POTASSIUM PO Take 1 tablet by mouth daily. 99 mg daily    . Vitamin D, Ergocalciferol, (DRISDOL) 50000 units CAPS capsule Take 50,000 Units by mouth every 7 (seven) days.     No current facility-administered medications for this visit.      Past Surgical History:  Procedure Laterality Date  . APPENDECTOMY    . CHOLECYSTECTOMY N/A 08/08/2014   Procedure: LAPAROSCOPIC CHOLECYSTECTOMY;  Surgeon: Aviva Signs, MD;  Location: [facility];  Service: General;  Laterality:  N/A;  . CORONARY STENT PLACEMENT    . LAPAROSCOPY WITH TUBAL LIGATION    . LEFT HEART CATHETERIZATION WITH CORONARY ANGIOGRAM N/A 11/06/2012   Procedure: LEFT HEART CATHETERIZATION WITH CORONARY ANGIOGRAM;  Surgeon: Blane Ohara, MD;  Location[facility] CATH LAB;  Service: Cardiovascular;  Laterality: N/A;  . TUBAL LIGATION       Allergies  Allergen Reactions  . Cheese Hives    On chin  . Aspirin Rash      Family History  Problem Relation Age of Onset  . Diabetes Maternal Aunt      Social History Ms. Lecker reports that she quit smoking about 4 years ago. Her smoking use included cigarettes. She has a 50.00 pack-year smoking history. She has never used smokeless tobacco. Ms. Karnik reports that she drinks alcohol.   Review of  Systems CONSTITUTIONAL: No weight loss, fever, chills, weakness or fatigue.  HEENT: Eyes: No visual loss, blurred vision, double vision or yellow sclerae.No hearing loss, sneezing, congestion, runny nose or sore throat.  SKIN: No rash or itching.  CARDIOVASCULAR: per hpi RESPIRATORY: No shortness of breath, cough or sputum.  GASTROINTESTINAL: No anorexia, nausea, vomiting or diarrhea. No abdominal pain or blood.  GENITOURINARY: No burning on urination, no polyuria NEUROLOGICAL: No headache, dizziness, syncope, paralysis, ataxia, numbness or tingling in the extremities. No change in bowel or bladder control.  MUSCULOSKELETAL: No muscle, back pain, joint pain or stiffness.  LYMPHATICS: No enlarged nodes. No history of splenectomy.  PSYCHIATRIC: No history of depression or anxiety.  ENDOCRINOLOGIC: No reports of sweating, cold or heat intolerance. No polyuria or polydipsia.  Marland Kitchen   Physical Examination Vitals:   05/18/17 1351  BP: (!) 146/64  Pulse: 77  SpO2: 94%   Vitals:   05/18/17 1351  Weight: 167 lb (75.8 kg)  Height: 5\' 10"  (1.778 m)    Gen: resting comfortably, no acute distress HEENT: no scleral icterus, pupils equal round and reactive, no palptable cervical adenopathy,  CV: RRR, no m/r/g, no jvd Resp: Clear to auscultation bilaterally GI: abdomen is soft, non-tender, non-distended, normal bowel sounds, no hepatosplenomegaly MSK: extremities are warm, no edema.  Skin: warm, no rash Neuro:  no focal deficits Psych: appropriate affect   Diagnostic Studies 11/06/12 Cath Cardiac Catheterization and Percutaneous Coronary Intervention10.12.2014  Hemodynamics:  AO 175/88  LV 177/16  Coronary angiography:  Coronary dominance: right  Left mainstem: Patent vessel with 20-30% distal LM stenosis  Left anterior descending (LAD):Proximal irregularity. There is severe disease in the mid-LAD. Diffuse 50% stenosis leading into a 99% mid-vessel stenosis. The first diagonal  is large with 50% ostial stenosis and it originates at the proximal aspect of LAD plaque. The distal LAD is tortuous and widely patent as it wraps the LV apex.  **The mid-LAD was successfully stented using a 2.5x24 mm Promus Premier drug-eluting stent.** Left circumflex (LCx):50% ostial stenosis, 50% stenosis of OM1 with sequential lesions  Right coronary artery (RCA):normal caliber, dominant vessel. There is diffuse disease noted. There is 50% mid-vessel stenosis and 50-70% bifurcational disease at the PDA/PLA origin.  Left ventriculography:Left ventricular systolic function is moderately depressed. There is hypokinesis of the distal anterior wall and anteroapex. The LVEF is estimated at 45%. Final Conclusions:  1. Severe mid-LAD stenosis treated successfully with primary PCI using a DES 2. Moderate RCA and LCx stenosis 3. Moderate LV dysfunction  11/07/12 Echo: LVEF 55%, mild LVH, diastolic dysfunction without grade specified, hypokinesis apical anteroseptal and apical inferoseptal segments.  11/17/12 Clinic EKG SR,  normal axis, anteroseptal Q waves, diffuse T wave inversions    07/2014 Nuclear stress  There was no ST segment deviation noted during stress.  The study is normal.  This is a low risk study.  The left ventricular ejection fraction is mildly decreased (45-54%).  There is a small mild intensity distal anterior wall defect at rest. Postinjection a similar but slightly more intense defect is noted. This wall segment has normal wall motion. Findings likely secondary to differences in breast positioning/soft tissue attenuation. Less likely area of small ischemia. Low risk findings overall.       Assessment and Plan  1. CAD  - no exertional symptoms, continue current meds - EKG today in clinic SR, no acute ischemic changes  2. HL  -continue statin, request labs from pcp   3. DM2 - per pcp  - from cardiac standpoint she is on ACE-I and  statin   F/u 6 months       Arnoldo Lenis, M.D.

## 2017-05-18 NOTE — Patient Instructions (Signed)
Medication Instructions:  Your physician recommends that you continue on your current medications as directed. Please refer to the Current Medication list given to you today.   Labwork: NONE   Testing/Procedures: NONE   Follow-Up: Your physician wants you to follow-up in: 1 Year. You will receive a reminder letter in the mail two months in advance. If you don't receive a letter, please call our office to schedule the follow-up appointment.   Any Other Special Instructions Will Be Listed Below (If Applicable).     If you need a refill on your cardiac medications before your next appointment, please call your pharmacy.  Thank you for choosing [facility]!   DASH Eating Plan DASH stands for "Dietary Approaches to Stop Hypertension." The DASH eating plan is a healthy eating plan that has been shown to reduce high blood pressure (hypertension). It may also reduce your risk for type 2 diabetes, heart disease, and stroke. The DASH eating plan may also help with weight loss. What are tips for following this plan? General guidelines  Avoid eating more than 2,300 mg (milligrams) of salt (sodium) a day. If you have hypertension, you may need to reduce your sodium intake to 1,500 mg a day.  Limit alcohol intake to no more than 1 drink a day for nonpregnant women and 2 drinks a day for men. One drink equals 12 oz of beer, 5 oz of wine, or 1 oz of hard liquor.  Work with your health care provider to maintain a healthy body weight or to lose weight. Ask what an ideal weight is for you.  Get at least 30 minutes of exercise that causes your heart to beat faster (aerobic exercise) most days of the week. Activities may include walking, swimming, or biking.  Work with your health care provider or diet and nutrition specialist (dietitian) to adjust your eating plan to your individual calorie needs. Reading food labels  Check food labels for the amount of sodium per serving. Choose foods  with less than 5 percent of the Daily Value of sodium. Generally, foods with less than 300 mg of sodium per serving fit into this eating plan.  To find whole grains, look for the word "whole" as the first word in the ingredient list. Shopping  Buy products labeled as "low-sodium" or "no salt added."  Buy fresh foods. Avoid canned foods and premade or frozen meals. Cooking  Avoid adding salt when cooking. Use salt-free seasonings or herbs instead of table salt or sea salt. Check with your health care provider or pharmacist before using salt substitutes.  Do not fry foods. Cook foods using healthy methods such as baking, boiling, grilling, and broiling instead.  Cook with heart-healthy oils, such as olive, canola, soybean, or sunflower oil. Meal planning   Eat a balanced diet that includes: ? 5 or more servings of fruits and vegetables each day. At each meal, try to fill half of your plate with fruits and vegetables. ? Up to 6-8 servings of whole grains each day. ? Less than 6 oz of lean meat, poultry, or fish each day. A 3-oz serving of meat is about the same size as a deck of cards. One egg equals 1 oz. ? 2 servings of low-fat dairy each day. ? A serving of nuts, seeds, or beans 5 times each week. ? Heart-healthy fats. Healthy fats called Omega-3 fatty acids are found in foods such as flaxseeds and coldwater fish, like sardines, salmon, and mackerel.  Limit how much you  eat of the following: ? Canned or prepackaged foods. ? Food that is high in trans fat, such as fried foods. ? Food that is high in saturated fat, such as fatty meat. ? Sweets, desserts, sugary drinks, and other foods with added sugar. ? Full-fat dairy products.  Do not salt foods before eating.  Try to eat at least 2 vegetarian meals each week.  Eat more home-cooked food and less restaurant, buffet, and fast food.  When eating at a restaurant, ask that your food be prepared with less salt or no salt, if  possible. What foods are recommended? The items listed may not be a complete list. Talk with your dietitian about what dietary choices are best for you. Grains Whole-grain or whole-wheat bread. Whole-grain or whole-wheat pasta. Brown rice. Marissa Schneider. Bulgur. Whole-grain and low-sodium cereals. Pita bread. Low-fat, low-sodium crackers. Whole-wheat flour tortillas. Vegetables Fresh or frozen vegetables (raw, steamed, roasted, or grilled). Low-sodium or reduced-sodium tomato and vegetable juice. Low-sodium or reduced-sodium tomato sauce and tomato paste. Low-sodium or reduced-sodium canned vegetables. Fruits All fresh, dried, or frozen fruit. Canned fruit in natural juice (without added sugar). Meat and other protein foods Skinless chicken or [place]. Ground chicken or [place]. Pork with fat trimmed off. Fish and seafood. Egg whites. Dried beans, peas, or lentils. Unsalted nuts, nut butters, and seeds. Unsalted canned beans. Lean cuts of beef with fat trimmed off. Low-sodium, lean deli meat. Dairy Low-fat (1%) or fat-free (skim) milk. Fat-free, low-fat, or reduced-fat cheeses. Nonfat, low-sodium ricotta or cottage cheese. Low-fat or nonfat yogurt. Low-fat, low-sodium cheese. Fats and oils Soft margarine without trans fats. Vegetable oil. Low-fat, reduced-fat, or light mayonnaise and salad dressings (reduced-sodium). Canola, safflower, olive, soybean, and sunflower oils. Avocado. Seasoning and other foods Herbs. Spices. Seasoning mixes without salt. Unsalted popcorn and pretzels. Fat-free sweets. What foods are not recommended? The items listed may not be a complete list. Talk with your dietitian about what dietary choices are best for you. Grains Baked goods made with fat, such as croissants, muffins, or some breads. Dry pasta or rice meal packs. Vegetables Creamed or fried vegetables. Vegetables in a cheese sauce. Regular canned vegetables (not low-sodium or reduced-sodium). Regular canned  tomato sauce and paste (not low-sodium or reduced-sodium). Regular tomato and vegetable juice (not low-sodium or reduced-sodium). Marissa Schneider. Olives. Fruits Canned fruit in a light or heavy syrup. Fried fruit. Fruit in cream or butter sauce. Meat and other protein foods Fatty cuts of meat. Ribs. Fried meat. Marissa Schneider. Sausage. Bologna and other processed lunch meats. Salami. Fatback. Hotdogs. Bratwurst. Salted nuts and seeds. Canned beans with added salt. Canned or smoked fish. Whole eggs or egg yolks. Chicken or [place] with skin. Dairy Whole or 2% milk, cream, and half-and-half. Whole or full-fat cream cheese. Whole-fat or sweetened yogurt. Full-fat cheese. Nondairy creamers. Whipped toppings. Processed cheese and cheese spreads. Fats and oils Butter. Stick margarine. Lard. Shortening. Ghee. Bacon fat. Tropical oils, such as coconut, palm kernel, or palm oil. Seasoning and other foods Salted popcorn and pretzels. Onion salt, garlic salt, seasoned salt, table salt, and sea salt. Worcestershire sauce. Tartar sauce. Barbecue sauce. Teriyaki sauce. Soy sauce, including reduced-sodium. Steak sauce. Canned and packaged gravies. Fish sauce. Oyster sauce. Cocktail sauce. Horseradish that you find on the shelf. Ketchup. Mustard. Meat flavorings and tenderizers. Bouillon cubes. Hot sauce and Tabasco sauce. Premade or packaged marinades. Premade or packaged taco seasonings. Relishes. Regular salad dressings. Where to find more information:  National Heart, Lung, and Blood Institute: https://wilson-eaton.com/  American  Heart Association: www.heart.org Summary  The DASH eating plan is a healthy eating plan that has been shown to reduce high blood pressure (hypertension). It may also reduce your risk for type 2 diabetes, heart disease, and stroke.  With the DASH eating plan, you should limit salt (sodium) intake to 2,300 mg a day. If you have hypertension, you may need to reduce your sodium intake to 1,500 mg a day.  When  on the DASH eating plan, aim to eat more fresh fruits and vegetables, whole grains, lean proteins, low-fat dairy, and heart-healthy fats.  Work with your health care provider or diet and nutrition specialist (dietitian) to adjust your eating plan to your individual calorie needs. This information is not intended to replace advice given to you by your health care provider. Make sure you discuss any questions you have with your health care provider. Document Released: 01/01/2011 Document Revised: 01/06/2016 Document Reviewed: 01/06/2016 Elsevier Interactive Patient Education  [employer].

## 2017-05-20 ENCOUNTER — Encounter: Payer: Self-pay | Admitting: Cardiology

## 2017-06-19 ENCOUNTER — Other Ambulatory Visit: Payer: Self-pay | Admitting: Cardiology

## 2017-06-20 ENCOUNTER — Other Ambulatory Visit: Payer: Self-pay | Admitting: Cardiology

## 2017-09-26 ENCOUNTER — Other Ambulatory Visit: Payer: Self-pay | Admitting: Cardiology

## 2018-03-08 ENCOUNTER — Other Ambulatory Visit: Payer: Self-pay

## 2018-03-08 MED ORDER — ATORVASTATIN CALCIUM 80 MG PO TABS: ORAL_TABLET | ORAL | 3 refills | Status: DC

## 2018-03-08 MED ORDER — NITROGLYCERIN 0.4 MG SL SUBL: SUBLINGUAL_TABLET | SUBLINGUAL | 3 refills | Status: DC

## 2018-03-08 MED ORDER — PANTOPRAZOLE SODIUM 40 MG PO TBEC: 40.0000 mg | DELAYED_RELEASE_TABLET | Freq: Every day | ORAL | 3 refills | Status: DC

## 2018-03-08 MED ORDER — METOPROLOL TARTRATE 25 MG PO TABS: 25.0000 mg | ORAL_TABLET | Freq: Two times a day (BID) | ORAL | 3 refills | Status: DC

## 2018-03-08 MED ORDER — ENALAPRIL MALEATE 5 MG PO TABS: ORAL_TABLET | ORAL | 2 refills | Status: DC

## 2018-03-08 NOTE — Telephone Encounter (Signed)
Refilled fax request to [facility], all except metformin, pcp should refill

## 2018-03-14 ENCOUNTER — Other Ambulatory Visit: Payer: Self-pay

## 2018-03-14 MED ORDER — METOPROLOL TARTRATE 25 MG PO TABS: 25.0000 mg | ORAL_TABLET | Freq: Two times a day (BID) | ORAL | 3 refills | Status: DC

## 2018-03-14 MED ORDER — ENALAPRIL MALEATE 5 MG PO TABS: ORAL_TABLET | ORAL | 2 refills | Status: DC

## 2018-03-14 MED ORDER — PANTOPRAZOLE SODIUM 40 MG PO TBEC: 40.0000 mg | DELAYED_RELEASE_TABLET | Freq: Every day | ORAL | 3 refills | Status: DC

## 2018-03-14 NOTE — Telephone Encounter (Signed)
Per fax request from [facility] refilled enalapril, lopressor, pantoprazole

## 2018-05-11 ENCOUNTER — Telehealth: Payer: Self-pay | Admitting: *Deleted

## 2018-05-11 NOTE — Telephone Encounter (Signed)
   Cardiac Questionnaire:    Since your last visit or hospitalization:    1. Have you been having new or worsening chest pain? No   2. Have you been having new or worsening shortness of breath? No 3. Have you been having new or worsening leg swelling, wt gain, or increase in abdominal girth (pants fitting more tightly)? No    4. Have you had any passing out spells? No    *A YES to any of these questions would result in the appointment being kept. *If all the answers to these questions are NO, we should indicate that given the current situation regarding the worldwide coronarvirus pandemic, at the recommendation of the CDC, we are looking to limit gatherings in our waiting area, and thus will reschedule their appointment beyond four weeks from today.            

## 2018-05-18 ENCOUNTER — Ambulatory Visit: Payer: Medicare Other | Admitting: Cardiology

## 2018-08-29 ENCOUNTER — Telehealth: Payer: Self-pay | Admitting: Cardiology

## 2018-08-29 NOTE — Telephone Encounter (Signed)
Patient took I NTG Friday night and again last nite for palpitations and CP.Sx's resolved both times after one NTG.Has apt tomorrow with Dr.Branch-scheduled 1 year f/u

## 2018-08-29 NOTE — Telephone Encounter (Signed)
Pt stated that she was having chest pain yesterday, laid down and took her Nitro and it went away. Please give pt a call [phone]

## 2018-08-30 ENCOUNTER — Telehealth (INDEPENDENT_AMBULATORY_CARE_PROVIDER_SITE_OTHER): Payer: Medicare PPO | Admitting: Cardiology

## 2018-08-30 ENCOUNTER — Encounter: Payer: Self-pay | Admitting: Cardiology

## 2018-08-30 ENCOUNTER — Telehealth: Payer: Self-pay

## 2018-08-30 ENCOUNTER — Other Ambulatory Visit: Payer: Self-pay

## 2018-08-30 VITALS — BP 140/70 | HR 74 | Wt 165.0 lb

## 2018-08-30 DIAGNOSIS — E118 Type 2 diabetes mellitus with unspecified complications: Secondary | ICD-10-CM

## 2018-08-30 DIAGNOSIS — E782 Mixed hyperlipidemia: Secondary | ICD-10-CM

## 2018-08-30 DIAGNOSIS — I2510 Atherosclerotic heart disease of native coronary artery without angina pectoris: Secondary | ICD-10-CM

## 2018-08-30 DIAGNOSIS — I10 Essential (primary) hypertension: Secondary | ICD-10-CM

## 2018-08-30 NOTE — Telephone Encounter (Signed)

## 2018-08-30 NOTE — Progress Notes (Signed)
Virtual Visit via Telephone Note   This visit type was conducted due to national recommendations for restrictions regarding the COVID-19 Pandemic (e.g. social distancing) in an effort to limit this patient's exposure and mitigate transmission in our community.  Due to her co-morbid illnesses, this patient is at least at moderate risk for complications without adequate follow up.  This format is felt to be most appropriate for this patient at this time.  The patient did not have access to video technology/had technical difficulties with video requiring transitioning to audio format only (telephone).  All issues noted in this document were discussed and addressed.  No physical exam could be performed with this format.  Please refer to the patient's chart for her  consent to telehealth for [facility].   Date:  08/30/2018   ID:  Marissa Schneider, DOB 08-Feb-1945, MRN [id number]  Patient Location: Home Provider Location: Office  PCP:  System, Provider Not In  Cardiologist:  Carlyle Dolly, MD  Electrophysiologist:  None   Evaluation Performed:  Follow-Up Visit  Chief Complaint:  Follow up visit  History of Present Illness:    Marissa Schneider is a 73 y.o. female seen today for follow up of the following medical problems.   1. CAD  - patient admitted to [facility] 10/12-10/14/14 with anterior wall STEMI, LVEF by LV gram 45% with anterior wall hypokinesis. LVEF 55% by echo with apical hypokinesis - s/p DES to mid LAD  - 07/2014 nuclear stress without clear ischemia  - has had chronic nonspecific intermittent chest pains over the years - episode of chest pain over the weekend. Pressing like feeling right chest. Came on with being upset. Not positional. Pain lasted few minutes. Took NG x2, 10-15 min after resolved. - still cuts her grass regularly with push mower, no exertional symptoms.    2. HL  - Jan 2018 TC 167 TG 127 HDL 45 LDL 97 - labs followed by pcp   3. DM2   - followed by pcp   4. HTN - has not taken meds yet today  120s/70s on average at home.    The patient does not have symptoms concerning for COVID-19 infection (fever, chills, cough, or new shortness of breath).    Past Medical History:  Diagnosis Date  . CAD (coronary artery disease)    a. 10/2012 Ant STEMI/PCI: LM 20-30d, LAD 50/26m (2.5x24 Promus Premier DES), D1 50ost, LCX 50ost, OM1 50/50, RCa 24m/d, 50-70d @ bifurcation of PDA/PLA, EF 45%.  . Diabetes mellitus without complication ([place])   . Hyperlipidemia   . Myocardial infarction ([place])   . Tobacco abuse   . Type II diabetes mellitus, uncontrolled ([place]) 11/08/2012   Past Surgical History:  Procedure Laterality Date  . APPENDECTOMY    . CHOLECYSTECTOMY N/A 08/08/2014   Procedure: LAPAROSCOPIC CHOLECYSTECTOMY;  Surgeon: Aviva Signs, MD;  Location: [facility]S;  Service: General;  Laterality: N/A;  . CORONARY STENT PLACEMENT    . LAPAROSCOPY WITH TUBAL LIGATION    . LEFT HEART CATHETERIZATION WITH CORONARY ANGIOGRAM N/A 11/06/2012   Procedure: LEFT HEART CATHETERIZATION WITH CORONARY ANGIOGRAM;  Surgeon: Blane Ohara, MD;  Location: [facility] CATH LAB;  Service: Cardiovascular;  Laterality: N/A;  . TUBAL LIGATION       Current Meds  Medication Sig  . acetaminophen (TYLENOL) 500 MG tablet Take 1,000 mg by mouth daily as needed for pain.  Marland Kitchen alum & mag hydroxide-simeth (MAALOX/MYLANTA) 200-200-20 MG/5ML suspension Take 15 mLs by mouth  every 6 (six) hours as needed for indigestion or heartburn.  . Ascorbic Acid (VITAMIN C PO) Take 1 tablet by mouth daily.  Marland Kitchen aspirin EC 81 MG tablet Take 81 mg by mouth daily.  Marland Kitchen atorvastatin (LIPITOR) 80 MG tablet TAKE 1 TABLET BY MOUTH AT 6 PM  . diphenhydrAMINE (BENADRYL) 25 MG tablet Take 50 mg by mouth daily as needed for allergies (congestion).   . enalapril (VASOTEC) 5 MG tablet TAKE 1 TABLET BY MOUTH TWICE A DAY  . KRILL OIL PO Take 1 capsule by mouth daily.  . metFORMIN (GLUCOPHAGE) 500  MG tablet Take by mouth 2 (two) times daily with a meal.  . metoprolol tartrate (LOPRESSOR) 25 MG tablet Take 1 tablet (25 mg total) by mouth 2 (two) times daily.  . Multiple Vitamin (MULTIVITAMIN WITH MINERALS) TABS tablet Take 1 tablet by mouth daily.  . nitroGLYCERIN (NITROSTAT) 0.4 MG SL tablet DISSOLVE 1 TABLET UNDER TONGUE EVERY 5 MINUTES FOR 3 DOSES AS NEEDED FOR CHEST PAIN  . oxyCODONE-acetaminophen (PERCOCET) 7.5-325 MG per tablet Take 1-2 tablets by mouth every 4 (four) hours as needed.  . pantoprazole (PROTONIX) 40 MG tablet Take 1 tablet (40 mg total) by mouth daily.  Marland Kitchen POTASSIUM PO Take 1 tablet by mouth daily. 99 mg daily  . Vitamin D, Ergocalciferol, (DRISDOL) 50000 units CAPS capsule Take 50,000 Units by mouth every 7 (seven) days.     Allergies:   Cheese and Aspirin   Social History   Tobacco Use  . Smoking status: Former Smoker    Packs/day: 1.00    Years: 50.00    Pack years: 50.00    Types: Cigarettes    Quit date: 11/05/2012    Years since quitting: 5.8  . Smokeless tobacco: Never Used  Substance Use Topics  . Alcohol use: Yes    Alcohol/week: 0.0 standard drinks    Comment: Drinks at social events  . Drug use: No     Family Hx: The patient's family history includes Diabetes in her maternal aunt.  ROS:   Please see the history of present illness.     All other systems reviewed and are negative.   Prior CV studies:   The following studies were reviewed today:  11/06/12 Cath Cardiac Catheterization and Percutaneous Coronary Intervention10.12.2014  Hemodynamics:  AO 175/88  LV 177/16  Coronary angiography:  Coronary dominance: right  Left mainstem: Patent vessel with 20-30% distal LM stenosis  Left anterior descending (LAD):Proximal irregularity. There is severe disease in the mid-LAD. Diffuse 50% stenosis leading into a 99% mid-vessel stenosis. The first diagonal is large with 50% ostial stenosis and it originates at the proximal aspect  of LAD plaque. The distal LAD is tortuous and widely patent as it wraps the LV apex.  **The mid-LAD was successfully stented using a 2.5x24 mm Promus Premier drug-eluting stent.** Left circumflex (LCx):50% ostial stenosis, 50% stenosis of OM1 with sequential lesions  Right coronary artery (RCA):normal caliber, dominant vessel. There is diffuse disease noted. There is 50% mid-vessel stenosis and 50-70% bifurcational disease at the PDA/PLA origin.  Left ventriculography:Left ventricular systolic function is moderately depressed. There is hypokinesis of the distal anterior wall and anteroapex. The LVEF is estimated at 45%. Final Conclusions:  1. Severe mid-LAD stenosis treated successfully with primary PCI using a DES 2. Moderate RCA and LCx stenosis 3. Moderate LV dysfunction  11/07/12 Echo: LVEF 55%, mild LVH, diastolic dysfunction without grade specified, hypokinesis apical anteroseptal and apical inferoseptal segments.  11/17/12 Clinic EKG SR,  normal axis, anteroseptal Q waves, diffuse T wave inversions    07/2014 Nuclear stress  There was no ST segment deviation noted during stress.  The study is normal.  This is a low risk study.  The left ventricular ejection fraction is mildly decreased (45-54%).  There is a small mild intensity distal anterior wall defect at rest. Postinjection a similar but slightly more intense defect is noted. This wall segment has normal wall motion. Findings likely secondary to differences in breast positioning/soft tissue attenuation. Less likely area of small ischemia. Low risk findings overall.   Labs/Other Tests and Data Reviewed:    EKG:  No ECG reviewed.  Recent Labs: No results found for requested labs within last 8760 hours.   Recent Lipid Panel Lab Results  Component Value Date/Time   CHOL 150 04/19/2013 01:10 PM   TRIG 118 04/19/2013 01:10 PM   HDL 46 04/19/2013 01:10 PM   CHOLHDL 3.3 04/19/2013 01:10 PM   LDLCALC 80  04/19/2013 01:10 PM    Wt Readings from Last 3 Encounters:  08/30/18 165 lb (74.8 kg)  05/18/17 167 lb (75.8 kg)  03/20/16 173 lb (78.5 kg)     Objective:    Vital Signs:  BP 140/70   Pulse 74   Wt 165 lb (74.8 kg)   BMI 23.68 kg/m    Normal affect. Normal speech pattern and tone. Comfortable, no apparent distress. No audible signs of SOB or wheezing.   ASSESSMENT & PLAN:    1. CAD  - chronic atypical symptoms, continue to monitor at this time. If change or progression consider ischemic testing at that time - continue current meds  2. HL  - request labs from pcp, continue statin   3. DM2 -per pcp - continue statin, ACE-I  4. HTN - has not taken meds yet today, her reported overall trends are at goal - continue current med     COVID-19 Education: The signs and symptoms of COVID-19 were discussed with the patient and how to seek care for testing (follow up with PCP or arrange E-visit).  The importance of social distancing was discussed today.  Time:   Today, I have spent 22 minutes with the patient with telehealth technology discussing the above problems.     Medication Adjustments/Labs and Tests Ordered: Current medicines are reviewed at length with the patient today.  Concerns regarding medicines are outlined above.   Tests Ordered: No orders of the defined types were placed in this encounter.   Medication Changes: No orders of the defined types were placed in this encounter.   Follow Up:  In Person in 6 month(s)  Signed, Carlyle Dolly, MD  08/30/2018 11:34 AM    [place]

## 2018-08-30 NOTE — Patient Instructions (Signed)

## 2018-12-26 ENCOUNTER — Other Ambulatory Visit: Payer: Self-pay | Admitting: Cardiology

## 2019-03-28 LAB — EXTERNAL GENERIC LAB PROCEDURE: COLOGUARD: NEGATIVE

## 2019-03-28 LAB — COLOGUARD: COLOGUARD: NEGATIVE

## 2019-05-29 ENCOUNTER — Other Ambulatory Visit: Payer: Self-pay | Admitting: Cardiology

## 2019-07-20 ENCOUNTER — Ambulatory Visit: Payer: Medicare PPO | Admitting: Cardiology

## 2019-07-21 ENCOUNTER — Encounter: Payer: Self-pay | Admitting: *Deleted

## 2019-07-21 ENCOUNTER — Other Ambulatory Visit: Payer: Self-pay

## 2019-07-21 ENCOUNTER — Ambulatory Visit (INDEPENDENT_AMBULATORY_CARE_PROVIDER_SITE_OTHER): Payer: Medicare PPO | Admitting: Cardiology

## 2019-07-21 VITALS — BP 132/66 | HR 53 | Ht 70.0 in | Wt 157.0 lb

## 2019-07-21 DIAGNOSIS — I2510 Atherosclerotic heart disease of native coronary artery without angina pectoris: Secondary | ICD-10-CM

## 2019-07-21 DIAGNOSIS — I10 Essential (primary) hypertension: Secondary | ICD-10-CM | POA: Diagnosis not present

## 2019-07-21 DIAGNOSIS — E782 Mixed hyperlipidemia: Secondary | ICD-10-CM | POA: Diagnosis not present

## 2019-07-21 NOTE — Patient Instructions (Addendum)
Medication Instructions:  Your physician recommends that you continue on your current medications as directed. Please refer to the Current Medication list given to you today.  *If you need a refill on your cardiac medications before your next appointment, please call your pharmacy*   Lab Work: None ordered  If you have labs (blood work) drawn today and your tests are completely normal, you will receive your results only by: Marland Kitchen MyChart Message (if you have MyChart) OR . A paper copy in the mail If you have any lab test that is abnormal or we need to change your treatment, we will call you to review the results.   Testing/Procedures: None ordered   Follow-Up: At [facility], you and your health needs are our priority.  As part of our continuing mission to provide you with exceptional heart care, we have created designated Provider Care Teams.  These Care Teams include your primary Cardiologist (physician) and Advanced Practice Providers (APPs -  Physician Assistants and Nurse Practitioners) who all work together to provide you with the care you need, when you need it.  We recommend signing up for the patient portal called "MyChart".  Sign up information is provided on this After Visit Summary.  MyChart is used to connect with patients for Virtual Visits (Telemedicine).  Patients are able to view lab/test results, encounter notes, upcoming appointments, etc.  Non-urgent messages can be sent to your provider as well.   To learn more about what you can do with MyChart, go to [email or web address].    Your next appointment:   12 month(s)  The format for your next appointment:   In Person  Provider:   Carlyle Dolly, MD   Other Instructions

## 2019-07-21 NOTE — Progress Notes (Signed)
Clinical Summary Ms. Kreher is a 74 y.o.female  seen today for follow up of the following medical problems.   1. CAD  - patient admitted to [facility] 10/12-10/14/14 with anterior wall STEMI, LVEF by LV gram 45% with anterior wall hypokinesis. LVEF 55% by echo with apical hypokinesis - s/p DES to mid LAD  - 07/2014 nuclear stress without clear ischemia  - has had chronic nonspecific intermittent chest pains over the years - episode of chest pain over the weekend. Pressing like feeling right chest. Came on with being upset. Not positional. Pain lasted few minutes. Took NG x2, 10-15 min after resolved. - still cuts her grass regularly with push mower, no exertional symptoms.    - no recent chest pain. No SOB/DOE - compliant w meds.   2. HL  - Jan 2018 TC 167 TG 127 HDL 45 LDL 97 - labs followed by pcp, with recent labs earlier this month - remains compliant with statin   3. DM2  - followed by pcp   4. HTN -compliant with meds   SH: had covid vaccine x 2.  Past Medical History:  Diagnosis Date  . CAD (coronary artery disease)    a. 10/2012 Ant STEMI/PCI: LM 20-30d, LAD 50/31m (2.5x24 Promus Premier DES), D1 50ost, LCX 50ost, OM1 50/50, RCa 76m/d, 50-70d @ bifurcation of PDA/PLA, EF 45%.  . Diabetes mellitus without complication ([place])   . Hyperlipidemia   . Myocardial infarction ([place])   . Tobacco abuse   . Type II diabetes mellitus, uncontrolled ([place]) 11/08/2012     Allergies  Allergen Reactions  . Cheese Hives    On chin  . Aspirin Rash     Current Outpatient Medications  Medication Sig Dispense Refill  . acetaminophen (TYLENOL) 500 MG tablet Take 1,000 mg by mouth daily as needed for pain.    Marland Kitchen alum & mag hydroxide-simeth (MAALOX/MYLANTA) 200-200-20 MG/5ML suspension Take 15 mLs by mouth every 6 (six) hours as needed for indigestion or heartburn.    . Ascorbic Acid (VITAMIN C PO) Take 1 tablet by mouth daily.    Marland Kitchen aspirin EC 81 MG tablet Take  81 mg by mouth daily.    Marland Kitchen atorvastatin (LIPITOR) 80 MG tablet TAKE 1 TABLET AT 6 PM 90 tablet 3  . diphenhydrAMINE (BENADRYL) 25 MG tablet Take 50 mg by mouth daily as needed for allergies (congestion).     . enalapril (VASOTEC) 5 MG tablet TAKE 1 TABLET BY MOUTH TWICE A DAY 180 tablet 2  . KRILL OIL PO Take 1 capsule by mouth daily.    . metFORMIN (GLUCOPHAGE) 500 MG tablet Take by mouth 2 (two) times daily with a meal.    . metoprolol tartrate (LOPRESSOR) 25 MG tablet Take 1 tablet (25 mg total) by mouth 2 (two) times daily. 180 tablet 3  . Multiple Vitamin (MULTIVITAMIN WITH MINERALS) TABS tablet Take 1 tablet by mouth daily.    . nitroGLYCERIN (NITROSTAT) 0.4 MG SL tablet DISSOLVE 1 TABLET UNDER TONGUE EVERY 5 MINUTES FOR 3 DOSES AS NEEDED FOR CHEST PAIN 25 tablet 3  . oxyCODONE-acetaminophen (PERCOCET) 7.5-325 MG per tablet Take 1-2 tablets by mouth every 4 (four) hours as needed. 50 tablet 0  . pantoprazole (PROTONIX) 40 MG tablet Take 1 tablet (40 mg total) by mouth daily. 90 tablet 3  . POTASSIUM PO Take 1 tablet by mouth daily. 99 mg daily    . Vitamin D, Ergocalciferol, (DRISDOL) 50000 units CAPS capsule Take 50,000  Units by mouth every 7 (seven) days.     No current facility-administered medications for this visit.     Past Surgical History:  Procedure Laterality Date  . APPENDECTOMY    . CHOLECYSTECTOMY N/A 08/08/2014   Procedure: LAPAROSCOPIC CHOLECYSTECTOMY;  Surgeon: Aviva Signs, MD;  Location: [facility];  Service: General;  Laterality: N/A;  . CORONARY STENT PLACEMENT    . LAPAROSCOPY WITH TUBAL LIGATION    . LEFT HEART CATHETERIZATION WITH CORONARY ANGIOGRAM N/A 11/06/2012   Procedure: LEFT HEART CATHETERIZATION WITH CORONARY ANGIOGRAM;  Surgeon: Blane Ohara, MD;  Location: [facility] CATH LAB;  Service: Cardiovascular;  Laterality: N/A;  . TUBAL LIGATION       Allergies  Allergen Reactions  . Cheese Hives    On chin  . Aspirin Rash      Family History  Problem  Relation Age of Onset  . Diabetes Maternal Aunt      Social History Ms. Trautner reports that she quit smoking about 6 years ago. Her smoking use included cigarettes. She has a 50.00 pack-year smoking history. She has never used smokeless tobacco. Ms. Scarber reports current alcohol use.   Review of Systems CONSTITUTIONAL: No weight loss, fever, chills, weakness or fatigue.  HEENT: Eyes: No visual loss, blurred vision, double vision or yellow sclerae.No hearing loss, sneezing, congestion, runny nose or sore throat.  SKIN: No rash or itching.  CARDIOVASCULAR: per hpi RESPIRATORY: No shortness of breath, cough or sputum.  GASTROINTESTINAL: No anorexia, nausea, vomiting or diarrhea. No abdominal pain or blood.  GENITOURINARY: No burning on urination, no polyuria NEUROLOGICAL: No headache, dizziness, syncope, paralysis, ataxia, numbness or tingling in the extremities. No change in bowel or bladder control.  MUSCULOSKELETAL: No muscle, back pain, joint pain or stiffness.  LYMPHATICS: No enlarged nodes. No history of splenectomy.  PSYCHIATRIC: No history of depression or anxiety.  ENDOCRINOLOGIC: No reports of sweating, cold or heat intolerance. No polyuria or polydipsia.  Marland Kitchen   Physical Examination Today's Vitals   07/21/19 1606  BP: 132/66  Pulse: (!) 53  Weight: 157 lb (71.2 kg)  Height: 5\' 10"  (1.778 m)   Body mass index is 22.53 kg/m.  Gen: resting comfortably, no acute distress HEENT: no scleral icterus, pupils equal round and reactive, no palptable cervical adenopathy,  CV: RRR, no m/r/g, no jvd Resp: Clear to auscultation bilaterally GI: abdomen is soft, non-tender, non-distended, normal bowel sounds, no hepatosplenomegaly MSK: extremities are warm, no edema.  Skin: warm, no rash Neuro:  no focal deficits Psych: appropriate affect   Diagnostic Studies   11/06/12 Cath Cardiac Catheterization and Percutaneous Coronary Intervention10.12.2014  Hemodynamics:  AO  175/88  LV 177/16  Coronary angiography:  Coronary dominance: right  Left mainstem: Patent vessel with 20-30% distal LM stenosis  Left anterior descending (LAD):Proximal irregularity. There is severe disease in the mid-LAD. Diffuse 50% stenosis leading into a 99% mid-vessel stenosis. The first diagonal is large with 50% ostial stenosis and it originates at the proximal aspect of LAD plaque. The distal LAD is tortuous and widely patent as it wraps the LV apex.  **The mid-LAD was successfully stented using a 2.5x24 mm Promus Premier drug-eluting stent.** Left circumflex (LCx):50% ostial stenosis, 50% stenosis of OM1 with sequential lesions  Right coronary artery (RCA):normal caliber, dominant vessel. There is diffuse disease noted. There is 50% mid-vessel stenosis and 50-70% bifurcational disease at the PDA/PLA origin.  Left ventriculography:Left ventricular systolic function is moderately depressed. There is hypokinesis of the distal anterior wall and  anteroapex. The LVEF is estimated at 45%. Final Conclusions:  1. Severe mid-LAD stenosis treated successfully with primary PCI using a DES 2. Moderate RCA and LCx stenosis 3. Moderate LV dysfunction  11/07/12 Echo: LVEF 55%, mild LVH, diastolic dysfunction without grade specified, hypokinesis apical anteroseptal and apical inferoseptal segments.  11/17/12 Clinic EKG SR, normal axis, anteroseptal Q waves, diffuse T wave inversions    07/2014 Nuclear stress  There was no ST segment deviation noted during stress.  The study is normal.  This is a low risk study.  The left ventricular ejection fraction is mildly decreased (45-54%).  There is a small mild intensity distal anterior wall defect at rest. Postinjection a similar but slightly more intense defect is noted. This wall segment has normal wall motion. Findings likely secondary to differences in breast positioning/soft tissue attenuation. Less likely area of small  ischemia. Low risk findings overall.   Assessment and Plan  1. CAD  -no recent symptoms, continue current meds - mild bradycardia on beta blocekr asymtomatic, continue to monitor.  - EKG today mild sinus brady, nonspecific ST/t changes  2. HL  -continue statin, request labs from pcp    3. HTN - at goal, continue current meds   F/u 1 year      Arnoldo Lenis, M.D.

## 2019-12-12 ENCOUNTER — Encounter: Payer: Self-pay | Admitting: Cardiology

## 2019-12-13 ENCOUNTER — Telehealth: Payer: Self-pay | Admitting: Cardiology

## 2019-12-13 NOTE — Telephone Encounter (Signed)
New message    Patient is in hospital in [place] and they are wanting to do Heart Cath, patient is wanting to speak to Dr Harl Bowie before having cath done

## 2019-12-13 NOTE — Telephone Encounter (Signed)
Spoke with pt son and son is willing to take pt to [facility] ED (son is 3 hours away from pt home but is in route) - stress test from [facility] scanned into pt

## 2019-12-13 NOTE — Telephone Encounter (Signed)
I can try to squeeze her sooner. If they are committed to leaving [facility] they could come to [facility] ER for evaluation and admission, could be transferred to [facility] from there for cath.    Zandra Abts MD

## 2019-12-13 NOTE — Telephone Encounter (Signed)
Misty from [place] called pt is currently admitted in [place] came in with chest pain and slightly elevated troponin stress test was done yesterday (received and placed on providers desk) cardiologist suggested heart cath and pt is refusing all treatment until Dr Harl Bowie reviews stress test

## 2019-12-13 NOTE — Telephone Encounter (Signed)
She could discuss with the doctors there a transfer to [facility] Since this is a lateral transfer (meaning the current hospital has the ability to do the procedure) there may be some issues with insurance covering the transfer. The other option is to have the procedure done there. I would worry about her going home if there is a blockage there as it could progress and be dangerous if not fixed. Can we get more information regarding her labs and troponin levels there.    Zandra Abts MD

## 2019-12-13 NOTE — Telephone Encounter (Signed)
After reviewing the stress test I would also recommend the heart catheterization, the stress test suggests there may be an area of blockage. A cath would show for sure and also allow for the blockage to potentially be fixed with a  stent   Carlyle Dolly MD

## 2019-12-13 NOTE — Telephone Encounter (Signed)
Discussed with pt and son - pt in process of being d/c'd from [facility] and doesn't want to be transferred to [facility] without discussing with Dr Harl Bowie first - pt refuses to have cath done at [facility] - says she does not trust those providers - son is otw to meet pt at her home - scheduled pt first available with Dr Harl Bowie (declined to scheduled with extenders for sooner appt) 02/16/2020 - pt and son aware and voiced understanding of concern of pt going home with potential blockage - will also request records from [facility] and place pt on wait list

## 2019-12-13 NOTE — Telephone Encounter (Signed)
Pt and son made aware and pt is refusing cath at [place] - wants cath done at [place] - pt aware that was certainly her choice but would need appt prior to [place] scheduling a cath as per protocol - pt refused appt with extender and next appt with Dr Harl Bowie would be January - will forward to Dr Harl Bowie with any suggestions

## 2019-12-14 NOTE — Telephone Encounter (Signed)
Can we follow up with this patient, I dont see any notes from Merrilee Seashore MD

## 2019-12-14 NOTE — Telephone Encounter (Signed)
Spoke with pt and son (son drove from [place], [place] last night) and pt did not want to go to [facility] ED last night said she was tired and wanted to sleep - son had to drive back to [place] today - pt says she feels fine denies any chest pain or symptoms at this time and declines to go to ED - explained again about stress test and possible blockage and pt said she would go to [facility] ED if she started having symptoms again - pt scheduled with Dr Harl Bowie 12/15 in [place]

## 2020-01-10 ENCOUNTER — Other Ambulatory Visit: Payer: Self-pay

## 2020-01-10 ENCOUNTER — Ambulatory Visit: Payer: Medicare PPO | Admitting: Cardiology

## 2020-01-10 ENCOUNTER — Encounter: Payer: Self-pay | Admitting: Cardiology

## 2020-01-10 VITALS — BP 124/60 | HR 83 | Ht 70.0 in | Wt 149.0 lb

## 2020-01-10 DIAGNOSIS — I2510 Atherosclerotic heart disease of native coronary artery without angina pectoris: Secondary | ICD-10-CM

## 2020-01-10 DIAGNOSIS — I10 Essential (primary) hypertension: Secondary | ICD-10-CM | POA: Diagnosis not present

## 2020-01-10 DIAGNOSIS — E782 Mixed hyperlipidemia: Secondary | ICD-10-CM

## 2020-01-10 NOTE — Patient Instructions (Signed)
Medication Instructions:  Your physician recommends that you continue on your current medications as directed. Please refer to the Current Medication list given to you today.  *If you need a refill on your cardiac medications before your next appointment, please call your pharmacy*   Lab Work: NONE  If you have labs (blood work) drawn today and your tests are completely normal, you will receive your results only by: . MyChart Message (if you have MyChart) OR . A paper copy in the mail If you have any lab test that is abnormal or we need to change your treatment, we will call you to review the results.   Testing/Procedures: NONE    Follow-Up: At [facility], you and your health needs are our priority.  As part of our continuing mission to provide you with exceptional heart care, we have created designated Provider Care Teams.  These Care Teams include your primary Cardiologist (physician) and Advanced Practice Providers (APPs -  Physician Assistants and Nurse Practitioners) who all work together to provide you with the care you need, when you need it.  We recommend signing up for the patient portal called "MyChart".  Sign up information is provided on this After Visit Summary.  MyChart is used to connect with patients for Virtual Visits (Telemedicine).  Patients are able to view lab/test results, encounter notes, upcoming appointments, etc.  Non-urgent messages can be sent to your provider as well.   To learn more about what you can do with MyChart, go to [email or web address].    Your next appointment:   4 month(s)  The format for your next appointment:   In Person  Provider:   Jonathan Branch, MD   Other Instructions Thank you for choosing [facility]!    

## 2020-01-10 NOTE — Progress Notes (Signed)
Clinical Summary Ms. Capece is a 74 y.o.female seen today for follow up of the following medical problems.   1. CAD  - patient admitted to [facility] 10/12-10/14/14 with anterior wall STEMI, LVEF by LV gram 45% with anterior wall hypokinesis. LVEF 55% by echo with apical hypokinesis - s/p DES to mid LAD  - 07/2014 nuclear stress without clear ischemia  - has had chronic nonspecific intermittent chest pains over the years - episode of chest pain over the weekend. Pressing like feeling right chest. Came on with being upset. Not positional. Pain lasted few minutes. Took NG x2, 10-15 min after resolved. - still cuts her grass regularly with push mower, no exertional symptoms.   - recently admitted to [facility] - stress test during that admit suggested basal inferior infarct with peri-infarct ischemia  - had some SOB that came on suddenly while sleeping - coughing, SOB. Felt like something stuck in her throat - so SOB could not let EMS in - no recurrent symptoms. She stopped lasix about 1 week after being home, no longer taking.   2. HL  - Jan 2018 TC 167 TG 127 HDL 45 LDL 97 -labs followed by pcp, with recent labs earlier this month - she is compliant with statin   3. DM2  - followed by pcp   4. HTN -she is compliant with meds   SH: had covid vaccine x 2.    Past Medical History:  Diagnosis Date  . CAD (coronary artery disease)    a. 10/2012 Ant STEMI/PCI: LM 20-30d, LAD 50/39m (2.5x24 Promus Premier DES), D1 50ost, LCX 50ost, OM1 50/50, RCa 60m/d, 50-70d @ bifurcation of PDA/PLA, EF 45%.  . Diabetes mellitus without complication ([place])   . Hyperlipidemia   . Myocardial infarction ([place])   . Tobacco abuse   . Type II diabetes mellitus, uncontrolled ([place]) 11/08/2012     Allergies  Allergen Reactions  . Cheese Hives    On chin  . Aspirin Rash     Current Outpatient Medications  Medication Sig Dispense Refill  . alum & mag  hydroxide-simeth (MAALOX/MYLANTA) 200-200-20 MG/5ML suspension Take 15 mLs by mouth every 6 (six) hours as needed for indigestion or heartburn.    . Ascorbic Acid (VITAMIN C PO) Take 1 tablet by mouth daily.    Marland Kitchen aspirin EC 81 MG tablet Take 81 mg by mouth daily.    Marland Kitchen atorvastatin (LIPITOR) 80 MG tablet TAKE 1 TABLET AT 6 PM 90 tablet 3  . diphenhydrAMINE (BENADRYL) 25 MG tablet Take 50 mg by mouth daily as needed for allergies (congestion).     . enalapril (VASOTEC) 5 MG tablet TAKE 1 TABLET BY MOUTH TWICE A DAY 180 tablet 2  . IBUPROFEN PO Take 200 mg by mouth 3 (three) times daily as needed.    Marland Kitchen KRILL OIL PO Take 1 capsule by mouth daily.    . metFORMIN (GLUCOPHAGE) 500 MG tablet Take by mouth 2 (two) times daily with a meal.    . metoprolol tartrate (LOPRESSOR) 25 MG tablet Take 1 tablet (25 mg total) by mouth 2 (two) times daily. 180 tablet 3  . Multiple Vitamin (MULTIVITAMIN WITH MINERALS) TABS tablet Take 1 tablet by mouth daily.    . nitroGLYCERIN (NITROSTAT) 0.4 MG SL tablet DISSOLVE 1 TABLET UNDER TONGUE EVERY 5 MINUTES FOR 3 DOSES AS NEEDED FOR CHEST PAIN 25 tablet 3  . pantoprazole (PROTONIX) 40 MG tablet Take 1 tablet (40 mg total) by mouth  daily. 90 tablet 3  . POTASSIUM PO Take 1 tablet by mouth daily. 99 mg daily    . Vitamin D, Ergocalciferol, (DRISDOL) 50000 units CAPS capsule Take 50,000 Units by mouth every 7 (seven) days.     No current facility-administered medications for this visit.     Past Surgical History:  Procedure Laterality Date  . APPENDECTOMY    . CHOLECYSTECTOMY N/A 08/08/2014   Procedure: LAPAROSCOPIC CHOLECYSTECTOMY;  Surgeon: Aviva Signs, MD;  Location: [facility];  Service: General;  Laterality: N/A;  . CORONARY STENT PLACEMENT    . LAPAROSCOPY WITH TUBAL LIGATION    . LEFT HEART CATHETERIZATION WITH CORONARY ANGIOGRAM N/A 11/06/2012   Procedure: LEFT HEART CATHETERIZATION WITH CORONARY ANGIOGRAM;  Surgeon: Blane Ohara, MD;  Location: [facility] CATH LAB;   Service: Cardiovascular;  Laterality: N/A;  . TUBAL LIGATION       Allergies  Allergen Reactions  . Cheese Hives    On chin  . Aspirin Rash      Family History  Problem Relation Age of Onset  . Diabetes Maternal Aunt      Social History Ms. Larabee reports that she quit smoking about 7 years ago. Her smoking use included cigarettes. She has a 50.00 pack-year smoking history. She has never used smokeless tobacco. Ms. Leclere reports current alcohol use.   Review of Systems CONSTITUTIONAL: No weight loss, fever, chills, weakness or fatigue.  HEENT: Eyes: No visual loss, blurred vision, double vision or yellow sclerae.No hearing loss, sneezing, congestion, runny nose or sore throat.  SKIN: No rash or itching.  CARDIOVASCULAR: per hpi RESPIRATORY: No shortness of breath, cough or sputum.  GASTROINTESTINAL: No anorexia, nausea, vomiting or diarrhea. No abdominal pain or blood.  GENITOURINARY: No burning on urination, no polyuria NEUROLOGICAL: No headache, dizziness, syncope, paralysis, ataxia, numbness or tingling in the extremities. No change in bowel or bladder control.  MUSCULOSKELETAL: No muscle, back pain, joint pain or stiffness.  LYMPHATICS: No enlarged nodes. No history of splenectomy.  PSYCHIATRIC: No history of depression or anxiety.  ENDOCRINOLOGIC: No reports of sweating, cold or heat intolerance. No polyuria or polydipsia.  Marland Kitchen   Physical Examination Today's Vitals   01/10/20 1416  BP: 124/60  Pulse: 83  SpO2: 97%  Weight: 149 lb (67.6 kg)  Height: 5\' 10"  (1.778 m)   Body mass index is 21.38 kg/m.  Gen: resting comfortably, no acute distress HEENT: no scleral icterus, pupils equal round and reactive, no palptable cervical adenopathy,  CV: RRR, no m/rg, n ojvd Resp: Clear to auscultation bilaterally GI: abdomen is soft, non-tender, non-distended, normal bowel sounds, no hepatosplenomegaly MSK: extremities are warm, no edema.  Skin: warm, no rash Neuro:   no focal deficits Psych: appropriate affect   Diagnostic Studies 11/06/12 Cath Cardiac Catheterization and Percutaneous Coronary Intervention10.12.2014  Hemodynamics:  AO 175/88  LV 177/16  Coronary angiography:  Coronary dominance: right  Left mainstem: Patent vessel with 20-30% distal LM stenosis  Left anterior descending (LAD):Proximal irregularity. There is severe disease in the mid-LAD. Diffuse 50% stenosis leading into a 99% mid-vessel stenosis. The first diagonal is large with 50% ostial stenosis and it originates at the proximal aspect of LAD plaque. The distal LAD is tortuous and widely patent as it wraps the LV apex.  **The mid-LAD was successfully stented using a 2.5x24 mm Promus Premier drug-eluting stent.** Left circumflex (LCx):50% ostial stenosis, 50% stenosis of OM1 with sequential lesions  Right coronary artery (RCA):normal caliber, dominant vessel. There is diffuse disease noted.  There is 50% mid-vessel stenosis and 50-70% bifurcational disease at the PDA/PLA origin.  Left ventriculography:Left ventricular systolic function is moderately depressed. There is hypokinesis of the distal anterior wall and anteroapex. The LVEF is estimated at 45%. Final Conclusions:  1. Severe mid-LAD stenosis treated successfully with primary PCI using a DES 2. Moderate RCA and LCx stenosis 3. Moderate LV dysfunction  11/07/12 Echo: LVEF 55%, mild LVH, diastolic dysfunction without grade specified, hypokinesis apical anteroseptal and apical inferoseptal segments.  11/17/12 Clinic EKG SR, normal axis, anteroseptal Q waves, diffuse T wave inversions    07/2014 Nuclear stress  There was no ST segment deviation noted during stress.  The study is normal.  This is a low risk study.  The left ventricular ejection fraction is mildly decreased (45-54%).  There is a small mild intensity distal anterior wall defect at rest. Postinjection a similar but slightly more  intense defect is noted. This wall segment has normal wall motion. Findings likely secondary to differences in breast positioning/soft tissue attenuation. Less likely area of small ischemia. Low risk findings overall.     Assessment and Plan  1. CAD  - no recent significant cardiac symptoms, continue current meds  2. HL  -continue statin, request labs from pcp    3. HTN - bp at goal, continue currentmeds     Arnoldo Lenis, M.D.

## 2020-01-29 ENCOUNTER — Telehealth: Payer: Self-pay | Admitting: Cardiology

## 2020-01-29 ENCOUNTER — Encounter: Payer: Self-pay | Admitting: *Deleted

## 2020-01-29 ENCOUNTER — Other Ambulatory Visit: Payer: Self-pay

## 2020-01-29 NOTE — Telephone Encounter (Signed)
Patient called stating that she was recently inpatient at [facility] [address], [place], [place].  She states that they changed her medications. She also wants to discuss with Dr. Wyline Mood about having a Cardiac Catheterization.

## 2020-01-29 NOTE — Telephone Encounter (Signed)
Patient states she went to ED at [facility] where they told her that she has blockages in her heart and she wants Dr. Wyline Mood to take a look. Will try to retrieve [facility] notes on this matter

## 2020-01-30 NOTE — Telephone Encounter (Signed)
Ms. Leckey has a current appointment with Dr. Wyline Mood on 05/14/20, but needs a sooner appointment with [facility] to discuss cardiac catheterization.

## 2020-02-16 ENCOUNTER — Ambulatory Visit: Payer: Medicare PPO | Admitting: Cardiology

## 2020-02-16 NOTE — Telephone Encounter (Signed)
Tried to reach patient at both numbers no answer no vm (full on hm)

## 2020-02-27 NOTE — Progress Notes (Signed)
Cardiology Office Note:    Date:  02/28/2020   ID:  Marissa Schneider, DOB 1945-05-03, MRN [id number]  PCP:  Michell Heinrich, DO  [facility] Cardiologist:  Carlyle Dolly, MD   [facility] Electrophysiologist:  None   Referring MD: Michell Heinrich, DO   Chief Complaint:  Hospitalization Follow-up (NSTEMI)    Patient Profile:    Marissa Schneider is a 75 y.o. female with:   Coronary artery disease   S/p anterior STEMI in 10/2012 >> PCI: DES to LAD  Myoview ([facility] in [place]) 11/21: inf-lat scar with peri-infarct ischemia  Hyperlipidemia   Hypertension   Diabetes mellitus   Prior CV studies:   Myoview 12/12/19 [facility]) Inf, inf-lat, lat infarct with peri-infarct ischemia; EF 31  Echocardiogram 08/05/14 Mild focal basal septal LVH, EF 60-65, no rWMA, Gr 1 DD, mild MR   Cardiac catheterization 11/06/12 LM dist 20-30 LAD mid 99; D1 ost 50 LCx ost 50; OM1 50 RCA mid 50; PDA/PLA bifurcational 50-70 EF 45 PCI:  2.5 x 24 mm Promus DES to LAD   Records from [facility] in [place], [place] Echocardiogram 12/12/2019: EF 50-55, GRII DD PASP 52, moderate MR, moderate to severe TR  History of Present Illness:    Marissa Schneider was last seen by Dr. Harl Bowie in 12/21.  Pt called in recently regarding abnormal Myoview done during admit in 11/21 to [facility] in [place].  She would like to discuss proceeding with cardiac catheterization and is seen for evaluation.    After her last visit with Dr. Harl Bowie, she was admitted again at [facility] in [place].  She does have some paperwork from that admission.  The records indicate she had a non-STEMI and was treated for acute heart failure.  She was placed on clopidogrel, lisinopril and bumetanide.  She had an allergic reaction to furosemide.  She declined cardiac catheterization during that admission and was asked to follow-up here.  She just ran out of her medications in the last couple of days.  Since discharge she has done well  without chest discomfort, shortness of breath, orthopnea, leg edema or syncope.      Past Medical History:  Diagnosis Date  . CAD (coronary artery disease)    a. 10/2012 Ant STEMI/PCI: LM 20-30d, LAD 50/61m (2.5x24 Promus Premier DES), D1 50ost, LCX 50ost, OM1 50/50, RCa 61m/d, 50-70d @ bifurcation of PDA/PLA, EF 45%.  . Diabetes mellitus without complication ([place])   . Hyperlipidemia   . Myocardial infarction ([place])   . Tobacco abuse   . Type II diabetes mellitus, uncontrolled ([place]) 11/08/2012    Current Medications: Current Meds  Medication Sig  . ACCU-CHEK AVIVA PLUS test strip   . Accu-Chek Softclix Lancets lancets   . alum & mag hydroxide-simeth (MAALOX/MYLANTA) 200-200-20 MG/5ML suspension Take 15 mLs by mouth every 6 (six) hours as needed for indigestion or heartburn.  . Ascorbic Acid (VITAMIN C PO) Take 1 tablet by mouth daily.  Marland Kitchen aspirin EC 81 MG tablet Take 81 mg by mouth daily.  Marland Kitchen atorvastatin (LIPITOR) 80 MG tablet TAKE 1 TABLET AT 6 PM (Patient taking differently: Take 80 mg by mouth every evening.)  . clopidogrel (PLAVIX) 75 MG tablet Take 1 tablet (75 mg total) by mouth daily.  . diphenhydrAMINE (BENADRYL) 25 MG tablet Take 50 mg by mouth daily as needed for allergies (congestion).   . metFORMIN (GLUCOPHAGE) 500 MG tablet Take 500 mg by mouth 2 (two) times daily with a meal.  . metoprolol tartrate (  LOPRESSOR) 25 MG tablet Take 1 tablet (25 mg total) by mouth 2 (two) times daily.  . Multiple Vitamin (MULTIVITAMIN WITH MINERALS) TABS tablet Take 1 tablet by mouth daily.  . pantoprazole (PROTONIX) 40 MG tablet Take 1 tablet (40 mg total) by mouth daily.  . [DISCONTINUED] bumetanide (BUMEX) 1 MG tablet Take 1 tablet (1 mg total) by mouth daily.  . [DISCONTINUED] bumetanide (BUMEX) 1 MG tablet Take 1 tablet (1 mg total) by mouth daily.  . [DISCONTINUED] clopidogrel (PLAVIX) 75 MG tablet Take 1 tablet (75 mg total) by mouth daily.  . [DISCONTINUED] enalapril (VASOTEC) 5 MG  tablet TAKE 1 TABLET BY MOUTH TWICE A DAY  . [DISCONTINUED] IBUPROFEN PO Take 200 mg by mouth 3 (three) times daily as needed.  . [DISCONTINUED] KRILL OIL PO Take 1 capsule by mouth daily.  . [DISCONTINUED] lisinopril (ZESTRIL) 10 MG tablet Take 1 tablet (10 mg total) by mouth daily.  . [DISCONTINUED] lisinopril (ZESTRIL) 10 MG tablet Take 1 tablet (10 mg total) by mouth in the morning and at bedtime.  . [DISCONTINUED] nitroGLYCERIN (NITROSTAT) 0.4 MG SL tablet DISSOLVE 1 TABLET UNDER TONGUE EVERY 5 MINUTES FOR 3 DOSES AS NEEDED FOR CHEST PAIN  . [DISCONTINUED] POTASSIUM PO Take 1 tablet by mouth daily. 99 mg daily  . [DISCONTINUED] Vitamin D, Ergocalciferol, (DRISDOL) 50000 units CAPS capsule Take 50,000 Units by mouth every 7 (seven) days.     Allergies:   Cheese, Lasix [furosemide], and Aspirin   Social History   Tobacco Use  . Smoking status: Former Smoker    Packs/day: 1.00    Years: 50.00    Pack years: 50.00    Types: Cigarettes    Quit date: 11/05/2012    Years since quitting: 7.3  . Smokeless tobacco: Never Used  Vaping Use  . Vaping Use: Never used  Substance Use Topics  . Alcohol use: Yes    Alcohol/week: 0.0 standard drinks    Comment: Drinks at social events  . Drug use: No     Family Hx: The patient's family history includes Diabetes in her maternal aunt.  Review of Systems  Constitutional: Negative for fever.  Respiratory: Negative for cough.   Gastrointestinal: Negative for hematochezia and melena.  Genitourinary: Negative for hematuria.  All other systems reviewed and are negative.    EKGs/Labs/Other Test Reviewed:    EKG:  EKG is   ordered today.  The ekg ordered today demonstrates normal sinus rhythm, heart rate 75, normal axis, T wave inversions 2, 3, aVF, V3-V6  Recent Labs: 02/28/2020: BUN 25; Creatinine, Ser 1.29; Hemoglobin 11.1; Platelets 201; Potassium 4.0; Sodium 138   Recent Lipid Panel Lab Results  Component Value Date/Time   CHOL 150  04/19/2013 01:10 PM   TRIG 118 04/19/2013 01:10 PM   HDL 46 04/19/2013 01:10 PM   CHOLHDL 3.3 04/19/2013 01:10 PM   LDLCALC 80 04/19/2013 01:10 PM      Risk Assessment/Calculations:      Physical Exam:    VS:  BP 120/60   Pulse 71   Ht 5' 10.5" (1.791 m)   Wt 142 lb (64.4 kg)   SpO2 100%   BMI 20.09 kg/m     Wt Readings from Last 3 Encounters:  02/28/20 142 lb (64.4 kg)  01/10/20 149 lb (67.6 kg)  07/21/19 157 lb (71.2 kg)     Constitutional:      Appearance: Healthy appearance. Not in distress.  Neck:     Vascular: No JVR. JVD  normal.  Pulmonary:     Effort: Pulmonary effort is normal.     Breath sounds: No wheezing. No rales.  Cardiovascular:     Normal rate. Regular rhythm. Normal S1. Normal S2.     Murmurs: There is no murmur.  Edema:    Peripheral edema absent.  Abdominal:     Palpations: Abdomen is soft. There is no hepatomegaly.  Skin:    General: Skin is warm and dry.  Neurological:     Mental Status: Alert and oriented to person, place and time.     Cranial Nerves: Cranial nerves are intact.      ASSESSMENT & PLAN:    1. Coronary artery disease involving native coronary artery of native heart without angina pectoris History of anterior STEMI in 2014 treated with a DES to the LAD.  She was admitted to [facility] in November with chest discomfort and had an abnormal Myoview.  She declined cardiac catheterization at that time and was not having symptoms at her follow-up visit.  However, afterward, she was readmitted to the hospital in [place] with acute heart failure and a non-STEMI.  I do not have the complete records but according to her discharge paperwork, her high sensitive troponin was 600.  She had an allergic reaction to furosemide and was placed on bumetanide.  She has remained fairly stable since discharge without anginal symptoms.  However given her recent presentation, she should undergo cardiac catheterization for further evaluation.   Continue aspirin, clopidogrel, atorvastatin, lisinopril, as needed nitroglycerin.  Arrange cardiac catheterization next week.  Obtain follow-up CBC, BMET today.  Follow-up 2 weeks post catheterization.  Addendum: Records received from [place].  Her most recent hospitalization and the hospitalization in November were reviewed.  It appears she had an echocardiogram in November that demonstrated normal LV function.  Her EF was 31% on nuclear study.  It does not appear that she had a repeat echocardiogram during her second admission.  2. HFrEF (heart failure with reduced ejection fraction) ([place]) Her EF was 31 on nuclear stress test in November 2021.  I assume she has HFrEF.  She did have an echocardiogram during her most recent admission.  As noted, records have been requested.  Continue ACE inhibitor and beta-blocker.  If her EF is truly down, we should consider changing her ACE to [facility] or possibly adding spironolactone if her blood pressure will allow.  For now, proceed with cardiac catheterization as indicated.  Continue current dose of bumetanide.  3. Essential hypertension The patient's blood pressure is controlled on her current regimen.  Continue current therapy.   4. Mixed hyperlipidemia Continue high intensity statin therapy. Shared Decision Making/Informed Consent The risks [stroke (1 in 1000), death (1 in 1000), kidney failure [usually temporary] (1 in 500), bleeding (1 in 200), allergic reaction [possibly serious] (1 in 200)], benefits (diagnostic support and management of coronary artery disease) and alternatives of a cardiac catheterization were discussed in detail with Marissa Schneider and she is willing to proceed.    Dispo:  Return for Post Procedure Follow Up.   Medication Adjustments/Labs and Tests Ordered: Current medicines are reviewed at length with the patient today.  Concerns regarding medicines are outlined above.  Tests Ordered: Orders Placed This Encounter  Procedures  .  CBC w/Diff  . Basic Metabolic Panel (BMET)  . EKG 12-Lead   Medication Changes: Meds ordered this encounter  Medications  . DISCONTD: lisinopril (ZESTRIL) 10 MG tablet    Sig: Take 1 tablet (10 mg  total) by mouth daily.    Dispense:  90 tablet    Refill:  1  . DISCONTD: bumetanide (BUMEX) 1 MG tablet    Sig: Take 1 tablet (1 mg total) by mouth daily.    Dispense:  90 tablet    Refill:  1  . DISCONTD: clopidogrel (PLAVIX) 75 MG tablet    Sig: Take 1 tablet (75 mg total) by mouth daily.    Dispense:  90 tablet    Refill:  1  . DISCONTD: nitroGLYCERIN (NITROSTAT) 0.4 MG SL tablet    Sig: Place 1 tablet (0.4 mg total) under the tongue every 5 (five) minutes as needed for chest pain.    Dispense:  25 tablet    Refill:  3  . DISCONTD: lisinopril (ZESTRIL) 10 MG tablet    Sig: Take 1 tablet (10 mg total) by mouth in the morning and at bedtime.    Dispense:  60 tablet    Refill:  5  . DISCONTD: bumetanide (BUMEX) 1 MG tablet    Sig: Take 1 tablet (1 mg total) by mouth daily.    Dispense:  30 tablet    Refill:  5  . nitroGLYCERIN (NITROSTAT) 0.4 MG SL tablet    Sig: Place 1 tablet (0.4 mg total) under the tongue every 5 (five) minutes as needed for chest pain.    Dispense:  25 tablet    Refill:  3  . clopidogrel (PLAVIX) 75 MG tablet    Sig: Take 1 tablet (75 mg total) by mouth daily.    Dispense:  30 tablet    Refill:  5    Signed, Richardson Dopp, PA-C  02/28/2020 5:05 PM    [facility] [place], [place], [place]  [address] Phone: [phone]; Fax: [phone]

## 2020-02-27 NOTE — H&P (View-Only) (Signed)
Cardiology Office Note:    Date:  02/28/2020   ID:  Marissa Schneider, DOB 07-Dec-1945, MRN [id number]  PCP:  Michell Heinrich, DO  [facility] Cardiologist:  Carlyle Dolly, MD   [facility] Electrophysiologist:  None   Referring MD: Michell Heinrich, DO   Chief Complaint:  Hospitalization Follow-up (NSTEMI)    Patient Profile:    Marissa Schneider is a 75 y.o. female with:   Coronary artery disease   S/p anterior STEMI in 10/2012 >> PCI: DES to LAD  Myoview ([facility] in [place]) 11/21: inf-lat scar with peri-infarct ischemia  Hyperlipidemia   Hypertension   Diabetes mellitus   Prior CV studies:   Myoview 12/12/19 [facility]) Inf, inf-lat, lat infarct with peri-infarct ischemia; EF 31  Echocardiogram 08/05/14 Mild focal basal septal LVH, EF 60-65, no rWMA, Gr 1 DD, mild MR   Cardiac catheterization 11/06/12 LM dist 20-30 LAD mid 99; D1 ost 50 LCx ost 50; OM1 50 RCA mid 50; PDA/PLA bifurcational 50-70 EF 45 PCI:  2.5 x 24 mm Promus DES to LAD   Records from [facility] in [place], [place] Echocardiogram 12/12/2019: EF 50-55, GRII DD PASP 52, moderate MR, moderate to severe TR  History of Present Illness:    Marissa Schneider was last seen by Dr. Harl Bowie in 12/21.  Pt called in recently regarding abnormal Myoview done during admit in 11/21 to [facility] in [place].  She would like to discuss proceeding with cardiac catheterization and is seen for evaluation.    After her last visit with Dr. Harl Bowie, she was admitted again at [facility] in [place].  She does have some paperwork from that admission.  The records indicate she had a non-STEMI and was treated for acute heart failure.  She was placed on clopidogrel, lisinopril and bumetanide.  She had an allergic reaction to furosemide.  She declined cardiac catheterization during that admission and was asked to follow-up here.  She just ran out of her medications in the last couple of days.  Since discharge she has done well  without chest discomfort, shortness of breath, orthopnea, leg edema or syncope.      Past Medical History:  Diagnosis Date  . CAD (coronary artery disease)    a. 10/2012 Ant STEMI/PCI: LM 20-30d, LAD 50/22m (2.5x24 Promus Premier DES), D1 50ost, LCX 50ost, OM1 50/50, RCa 66m/d, 50-70d @ bifurcation of PDA/PLA, EF 45%.  . Diabetes mellitus without complication ([place])   . Hyperlipidemia   . Myocardial infarction ([place])   . Tobacco abuse   . Type II diabetes mellitus, uncontrolled ([place]) 11/08/2012    Current Medications: Current Meds  Medication Sig  . ACCU-CHEK AVIVA PLUS test strip   . Accu-Chek Softclix Lancets lancets   . alum & mag hydroxide-simeth (MAALOX/MYLANTA) 200-200-20 MG/5ML suspension Take 15 mLs by mouth every 6 (six) hours as needed for indigestion or heartburn.  . Ascorbic Acid (VITAMIN C PO) Take 1 tablet by mouth daily.  Marland Kitchen aspirin EC 81 MG tablet Take 81 mg by mouth daily.  Marland Kitchen atorvastatin (LIPITOR) 80 MG tablet TAKE 1 TABLET AT 6 PM (Patient taking differently: Take 80 mg by mouth every evening.)  . clopidogrel (PLAVIX) 75 MG tablet Take 1 tablet (75 mg total) by mouth daily.  . diphenhydrAMINE (BENADRYL) 25 MG tablet Take 50 mg by mouth daily as needed for allergies (congestion).   . metFORMIN (GLUCOPHAGE) 500 MG tablet Take 500 mg by mouth 2 (two) times daily with a meal.  . metoprolol tartrate (  LOPRESSOR) 25 MG tablet Take 1 tablet (25 mg total) by mouth 2 (two) times daily.  . Multiple Vitamin (MULTIVITAMIN WITH MINERALS) TABS tablet Take 1 tablet by mouth daily.  . pantoprazole (PROTONIX) 40 MG tablet Take 1 tablet (40 mg total) by mouth daily.  . [DISCONTINUED] bumetanide (BUMEX) 1 MG tablet Take 1 tablet (1 mg total) by mouth daily.  . [DISCONTINUED] bumetanide (BUMEX) 1 MG tablet Take 1 tablet (1 mg total) by mouth daily.  . [DISCONTINUED] clopidogrel (PLAVIX) 75 MG tablet Take 1 tablet (75 mg total) by mouth daily.  . [DISCONTINUED] enalapril (VASOTEC) 5 MG  tablet TAKE 1 TABLET BY MOUTH TWICE A DAY  . [DISCONTINUED] IBUPROFEN PO Take 200 mg by mouth 3 (three) times daily as needed.  . [DISCONTINUED] KRILL OIL PO Take 1 capsule by mouth daily.  . [DISCONTINUED] lisinopril (ZESTRIL) 10 MG tablet Take 1 tablet (10 mg total) by mouth daily.  . [DISCONTINUED] lisinopril (ZESTRIL) 10 MG tablet Take 1 tablet (10 mg total) by mouth in the morning and at bedtime.  . [DISCONTINUED] nitroGLYCERIN (NITROSTAT) 0.4 MG SL tablet DISSOLVE 1 TABLET UNDER TONGUE EVERY 5 MINUTES FOR 3 DOSES AS NEEDED FOR CHEST PAIN  . [DISCONTINUED] POTASSIUM PO Take 1 tablet by mouth daily. 99 mg daily  . [DISCONTINUED] Vitamin D, Ergocalciferol, (DRISDOL) 50000 units CAPS capsule Take 50,000 Units by mouth every 7 (seven) days.     Allergies:   Cheese, Lasix [furosemide], and Aspirin   Social History   Tobacco Use  . Smoking status: Former Smoker    Packs/day: 1.00    Years: 50.00    Pack years: 50.00    Types: Cigarettes    Quit date: 11/05/2012    Years since quitting: 7.3  . Smokeless tobacco: Never Used  Vaping Use  . Vaping Use: Never used  Substance Use Topics  . Alcohol use: Yes    Alcohol/week: 0.0 standard drinks    Comment: Drinks at social events  . Drug use: No     Family Hx: The patient's family history includes Diabetes in her maternal aunt.  Review of Systems  Constitutional: Negative for fever.  Respiratory: Negative for cough.   Gastrointestinal: Negative for hematochezia and melena.  Genitourinary: Negative for hematuria.  All other systems reviewed and are negative.    EKGs/Labs/Other Test Reviewed:    EKG:  EKG is   ordered today.  The ekg ordered today demonstrates normal sinus rhythm, heart rate 75, normal axis, T wave inversions 2, 3, aVF, V3-V6  Recent Labs: 02/28/2020: BUN 25; Creatinine, Ser 1.29; Hemoglobin 11.1; Platelets 201; Potassium 4.0; Sodium 138   Recent Lipid Panel Lab Results  Component Value Date/Time   CHOL 150  04/19/2013 01:10 PM   TRIG 118 04/19/2013 01:10 PM   HDL 46 04/19/2013 01:10 PM   CHOLHDL 3.3 04/19/2013 01:10 PM   LDLCALC 80 04/19/2013 01:10 PM      Risk Assessment/Calculations:      Physical Exam:    VS:  BP 120/60   Pulse 71   Ht 5' 10.5" (1.791 m)   Wt 142 lb (64.4 kg)   SpO2 100%   BMI 20.09 kg/m     Wt Readings from Last 3 Encounters:  02/28/20 142 lb (64.4 kg)  01/10/20 149 lb (67.6 kg)  07/21/19 157 lb (71.2 kg)     Constitutional:      Appearance: Healthy appearance. Not in distress.  Neck:     Vascular: No JVR. JVD  normal.  Pulmonary:     Effort: Pulmonary effort is normal.     Breath sounds: No wheezing. No rales.  Cardiovascular:     Normal rate. Regular rhythm. Normal S1. Normal S2.     Murmurs: There is no murmur.  Edema:    Peripheral edema absent.  Abdominal:     Palpations: Abdomen is soft. There is no hepatomegaly.  Skin:    General: Skin is warm and dry.  Neurological:     Mental Status: Alert and oriented to person, place and time.     Cranial Nerves: Cranial nerves are intact.      ASSESSMENT & PLAN:    1. Coronary artery disease involving native coronary artery of native heart without angina pectoris History of anterior STEMI in 2014 treated with a DES to the LAD.  She was admitted to [facility] in November with chest discomfort and had an abnormal Myoview.  She declined cardiac catheterization at that time and was not having symptoms at her follow-up visit.  However, afterward, she was readmitted to the hospital in [place] with acute heart failure and a non-STEMI.  I do not have the complete records but according to her discharge paperwork, her high sensitive troponin was 600.  She had an allergic reaction to furosemide and was placed on bumetanide.  She has remained fairly stable since discharge without anginal symptoms.  However given her recent presentation, she should undergo cardiac catheterization for further evaluation.   Continue aspirin, clopidogrel, atorvastatin, lisinopril, as needed nitroglycerin.  Arrange cardiac catheterization next week.  Obtain follow-up CBC, BMET today.  Follow-up 2 weeks post catheterization.  Addendum: Records received from [place].  Her most recent hospitalization and the hospitalization in November were reviewed.  It appears she had an echocardiogram in November that demonstrated normal LV function.  Her EF was 31% on nuclear study.  It does not appear that she had a repeat echocardiogram during her second admission.  2. HFrEF (heart failure with reduced ejection fraction) ([place]) Her EF was 31 on nuclear stress test in November 2021.  I assume she has HFrEF.  She did have an echocardiogram during her most recent admission.  As noted, records have been requested.  Continue ACE inhibitor and beta-blocker.  If her EF is truly down, we should consider changing her ACE to [facility] or possibly adding spironolactone if her blood pressure will allow.  For now, proceed with cardiac catheterization as indicated.  Continue current dose of bumetanide.  3. Essential hypertension The patient's blood pressure is controlled on her current regimen.  Continue current therapy.   4. Mixed hyperlipidemia Continue high intensity statin therapy. Shared Decision Making/Informed Consent The risks [stroke (1 in 1000), death (1 in 1000), kidney failure [usually temporary] (1 in 500), bleeding (1 in 200), allergic reaction [possibly serious] (1 in 200)], benefits (diagnostic support and management of coronary artery disease) and alternatives of a cardiac catheterization were discussed in detail with Marissa Schneider and she is willing to proceed.    Dispo:  Return for Post Procedure Follow Up.   Medication Adjustments/Labs and Tests Ordered: Current medicines are reviewed at length with the patient today.  Concerns regarding medicines are outlined above.  Tests Ordered: Orders Placed This Encounter  Procedures  .  CBC w/Diff  . Basic Metabolic Panel (BMET)  . EKG 12-Lead   Medication Changes: Meds ordered this encounter  Medications  . DISCONTD: lisinopril (ZESTRIL) 10 MG tablet    Sig: Take 1 tablet (10 mg  total) by mouth daily.    Dispense:  90 tablet    Refill:  1  . DISCONTD: bumetanide (BUMEX) 1 MG tablet    Sig: Take 1 tablet (1 mg total) by mouth daily.    Dispense:  90 tablet    Refill:  1  . DISCONTD: clopidogrel (PLAVIX) 75 MG tablet    Sig: Take 1 tablet (75 mg total) by mouth daily.    Dispense:  90 tablet    Refill:  1  . DISCONTD: nitroGLYCERIN (NITROSTAT) 0.4 MG SL tablet    Sig: Place 1 tablet (0.4 mg total) under the tongue every 5 (five) minutes as needed for chest pain.    Dispense:  25 tablet    Refill:  3  . DISCONTD: lisinopril (ZESTRIL) 10 MG tablet    Sig: Take 1 tablet (10 mg total) by mouth in the morning and at bedtime.    Dispense:  60 tablet    Refill:  5  . DISCONTD: bumetanide (BUMEX) 1 MG tablet    Sig: Take 1 tablet (1 mg total) by mouth daily.    Dispense:  30 tablet    Refill:  5  . nitroGLYCERIN (NITROSTAT) 0.4 MG SL tablet    Sig: Place 1 tablet (0.4 mg total) under the tongue every 5 (five) minutes as needed for chest pain.    Dispense:  25 tablet    Refill:  3  . clopidogrel (PLAVIX) 75 MG tablet    Sig: Take 1 tablet (75 mg total) by mouth daily.    Dispense:  30 tablet    Refill:  5    Signed, Richardson Dopp, PA-C  02/28/2020 5:05 PM    [facility] [place], [place], [place]  [address] Phone: [phone]; Fax: [phone]

## 2020-02-28 ENCOUNTER — Other Ambulatory Visit (HOSPITAL_COMMUNITY)
Admission: RE | Admit: 2020-02-28 | Discharge: 2020-02-28 | Disposition: A | Discharge status: Home / Self Care | Payer: Medicare HMO | Source: Ambulatory Visit | Attending: Physician Assistant | Admitting: Physician Assistant

## 2020-02-28 ENCOUNTER — Telehealth: Payer: Self-pay

## 2020-02-28 ENCOUNTER — Encounter: Payer: Self-pay | Admitting: Physician Assistant

## 2020-02-28 ENCOUNTER — Ambulatory Visit: Payer: Medicare HMO | Admitting: Physician Assistant

## 2020-02-28 ENCOUNTER — Encounter: Payer: Self-pay | Admitting: *Deleted

## 2020-02-28 ENCOUNTER — Other Ambulatory Visit: Payer: Self-pay

## 2020-02-28 VITALS — BP 120/60 | HR 71 | Ht 70.5 in | Wt 142.0 lb

## 2020-02-28 DIAGNOSIS — I5020 Unspecified systolic (congestive) heart failure: Secondary | ICD-10-CM

## 2020-02-28 DIAGNOSIS — I2510 Atherosclerotic heart disease of native coronary artery without angina pectoris: Secondary | ICD-10-CM

## 2020-02-28 DIAGNOSIS — R748 Abnormal levels of other serum enzymes: Secondary | ICD-10-CM

## 2020-02-28 DIAGNOSIS — E782 Mixed hyperlipidemia: Secondary | ICD-10-CM

## 2020-02-28 DIAGNOSIS — I10 Essential (primary) hypertension: Secondary | ICD-10-CM | POA: Insufficient documentation

## 2020-02-28 DIAGNOSIS — Z79899 Other long term (current) drug therapy: Secondary | ICD-10-CM

## 2020-02-28 LAB — CBC WITH DIFFERENTIAL/PLATELET
Abs Immature Granulocytes: 0.01 10*3/uL (ref 0.00–0.07)
Basophils Absolute: 0 10*3/uL (ref 0.0–0.1)
Basophils Relative: 1 %
Eosinophils Absolute: 0.1 10*3/uL (ref 0.0–0.5)
Eosinophils Relative: 3 %
HCT: 36.4 % (ref 36.0–46.0)
Hemoglobin: 11.1 g/dL — ABNORMAL LOW (ref 12.0–15.0)
Immature Granulocytes: 0 %
Lymphocytes Relative: 48 %
Lymphs Abs: 2.2 10*3/uL (ref 0.7–4.0)
MCH: 27 pg (ref 26.0–34.0)
MCHC: 30.5 g/dL (ref 30.0–36.0)
MCV: 88.6 fL (ref 80.0–100.0)
Monocytes Absolute: 0.3 10*3/uL (ref 0.1–1.0)
Monocytes Relative: 7 %
Neutro Abs: 1.9 10*3/uL (ref 1.7–7.7)
Neutrophils Relative %: 41 %
Platelets: 201 10*3/uL (ref 150–400)
RBC: 4.11 MIL/uL (ref 3.87–5.11)
RDW: 14.8 % (ref 11.5–15.5)
WBC: 4.7 10*3/uL (ref 4.0–10.5)
nRBC: 0 % (ref 0.0–0.2)

## 2020-02-28 LAB — BASIC METABOLIC PANEL
Anion gap: 7 (ref 5–15)
BUN: 25 mg/dL — ABNORMAL HIGH (ref 8–23)
CO2: 30 mmol/L (ref 22–32)
Calcium: 9 mg/dL (ref 8.9–10.3)
Chloride: 101 mmol/L (ref 98–111)
Creatinine, Ser: 1.29 mg/dL — ABNORMAL HIGH (ref 0.44–1.00)
GFR, Estimated: 43 mL/min — ABNORMAL LOW (ref >60)
Glucose, Bld: 102 mg/dL — ABNORMAL HIGH (ref 70–99)
Potassium: 4 mmol/L (ref 3.5–5.1)
Sodium: 138 mmol/L (ref 135–145)

## 2020-02-28 MED ORDER — LISINOPRIL 5 MG PO TABS: 5.0000 mg | ORAL_TABLET | Freq: Two times a day (BID) | ORAL | 1 refills | Status: DC

## 2020-02-28 MED ORDER — NITROGLYCERIN 0.4 MG SL SUBL: 0.4000 mg | SUBLINGUAL_TABLET | SUBLINGUAL | 3 refills | Status: DC | PRN

## 2020-02-28 MED ORDER — CLOPIDOGREL BISULFATE 75 MG PO TABS: 75.0000 mg | ORAL_TABLET | Freq: Every day | ORAL | 1 refills | Status: DC

## 2020-02-28 MED ORDER — LISINOPRIL 10 MG PO TABS: 10.0000 mg | ORAL_TABLET | Freq: Two times a day (BID) | ORAL | 5 refills | Status: DC

## 2020-02-28 MED ORDER — LISINOPRIL 10 MG PO TABS: 10.0000 mg | ORAL_TABLET | Freq: Every day | ORAL | 1 refills | Status: DC

## 2020-02-28 MED ORDER — BUMETANIDE 1 MG PO TABS: 1.0000 mg | ORAL_TABLET | Freq: Every day | ORAL | 1 refills | Status: DC

## 2020-02-28 MED ORDER — CLOPIDOGREL BISULFATE 75 MG PO TABS: 75.0000 mg | ORAL_TABLET | Freq: Every day | ORAL | 5 refills | Status: DC

## 2020-02-28 MED ORDER — BUMETANIDE 1 MG PO TABS: 1.0000 mg | ORAL_TABLET | ORAL | 2 refills | Status: DC

## 2020-02-28 MED ORDER — BUMETANIDE 1 MG PO TABS: 1.0000 mg | ORAL_TABLET | Freq: Every day | ORAL | 5 refills | Status: DC

## 2020-02-28 NOTE — Patient Instructions (Addendum)
Medication Instructions:  *If you need a refill on your cardiac medications before your next appointment, please call your pharmacy*  Lab Work: Your physician has recommended that you have lab work today: CBC and BMET If you have labs (blood work) drawn today and your tests are completely normal, you will receive your results only by: Marland Kitchen MyChart Message (if you have MyChart) OR . A paper copy in the mail If you have any lab test that is abnormal or we need to change your treatment, we will call you to review the results.  Testing/Procedures:  You will need to have a pre procedure Covid Screening Test on Monday 03/04/20 at 08:50 am   --This is a Drive Up Visit at [address] [place], [place] [address].  --Someone will direct you to the appropriate testing line. Stay in your car and someone will be with you shortly.  Your physician has requested that you have a cardiac catheterization. Cardiac catheterization is used to diagnose and/or treat various heart conditions. Doctors may recommend this procedure for a number of different reasons. The most common reason is to evaluate chest pain. Chest pain can be a symptom of coronary artery disease (CAD), and cardiac catheterization can show whether plaque is narrowing or blocking your heart's arteries. This procedure is also used to evaluate the valves, as well as measure the blood flow and oxygen levels in different parts of your heart. For further information please visit [email or web address]. Please follow instruction sheet, as given.  Follow-Up: At [facility], you and your health needs are our priority.  As part of our continuing mission to provide you with exceptional heart care, we have created designated Provider Care Teams.  These Care Teams include your primary Cardiologist (physician) and Advanced Practice Providers (APPs -  Physician Assistants and Nurse Practitioners) who all work together to provide you with the care you need, when you  need it.  We recommend signing up for the patient portal called "MyChart".  Sign up information is provided on this After Visit Summary.  MyChart is used to connect with patients for Virtual Visits (Telemedicine).  Patients are able to view lab/test results, encounter notes, upcoming appointments, etc.  Non-urgent messages can be sent to your provider as well.   To learn more about what you can do with MyChart, go to [email or web address].    Your next appointment:   Your physician recommends that you schedule a follow-up appointment in: 2 WEEKS from Cardiac Catheterization with Dr. Harl Bowie  The format for your next appointment:   In Person with Carlyle Dolly, [place] [address] Dept: [phone] Loc: [place]  02/28/2020  You are scheduled for a Cardiac Catheterization on Thursday, February 10 with Dr. Daneen Schick.  1. Please arrive at the [facility] (Main Entrance A) at [facility]: [address] [place], [place] [address] at 5:30 AM (This time is two hours before your procedure to ensure your preparation). Free valet parking service is available.   Special note: Every effort is made to have your procedure done on time. Please understand that emergencies sometimes delay scheduled procedures.  2. Diet: Do not eat solid foods after midnight.  The patient may have clear liquids until 5am upon the day of the procedure.  3. Labs: You will need to have blood drawn today. You do not need to be fasting.  4. Medication instructions in preparation for your procedure:  Do not take Diabetes Med Glucophage (Metformin) on the day of the procedure and HOLD 48 HOURS AFTER THE PROCEDURE.   Do NOT take your Bumetanide (Bumex) the morning of your procedure  On the morning of your procedure, take your Plavix/Clopidogrel and any morning medicines NOT  listed above.  You may use sips of water.  5. Plan for one night stay--bring personal belongings. 6. Bring a current list of your medications and current insurance cards. 7. You MUST have a responsible person to drive you home. 8. Someone MUST be with you the first 24 hours after you arrive home or your discharge will be delayed. 9. Please wear clothes that are easy to get on and off and wear slip-on shoes.  Thank you for allowing [place] to care for you!   -- [facility]

## 2020-02-28 NOTE — Addendum Note (Signed)
Addended byKathlen Mody, Nicki Reaper T on: 02/28/2020 05:08 PM   Modules accepted: Orders, SmartSet

## 2020-02-28 NOTE — Telephone Encounter (Signed)
RN called patient and discussed results, and that Richardson Dopp recommended the following: Decrease bumetanide to 1 mg every Monday, Wednesday, Friday only;  -Weigh daily and take extra dose of bumetanide 1 mg if weight goes up >3 pounds in 1 day -Hold lisinopril x1 day.  Then resume at 5 mg twice daily. -Repeat BMET Monday, 03/04/2020  Pt wrote down and repeated the recommendations. Pt stated she wanted to come to [facility] for labs and then go get her Covid screening on 03/04/20. Pt stated she understood plan and would call with any questions  RN scheduled her repeat bmet.

## 2020-02-28 NOTE — Telephone Encounter (Signed)
-----   Message from Liliane Shi, [place] sent at 02/28/2020  2:08 PM EST ----- Creatinine increased.  K+ are normal.  Hgb stable. PLAN:  -Decrease bumetanide to 1 mg every Monday, Wednesday, Friday only -Weigh daily and take extra dose of bumetanide 1 mg if weight goes up >3 pounds in 1 day -Hold lisinopril x1 day.  Then resume at 5 mg twice daily. -Repeat BMET Monday, 03/04/2020 Richardson Dopp, PA-C    02/28/2020 2:05 PM

## 2020-03-04 ENCOUNTER — Other Ambulatory Visit (HOSPITAL_COMMUNITY)
Admission: RE | Admit: 2020-03-04 | Discharge: 2020-03-04 | Disposition: A | Discharge status: Home / Self Care | Payer: Medicare HMO | Source: Ambulatory Visit | Attending: Physician Assistant | Admitting: Physician Assistant

## 2020-03-04 ENCOUNTER — Other Ambulatory Visit (HOSPITAL_COMMUNITY)
Admission: RE | Admit: 2020-03-04 | Discharge: 2020-03-04 | Disposition: A | Discharge status: Home / Self Care | Payer: Medicare HMO | Source: Ambulatory Visit | Attending: Interventional Cardiology | Admitting: Interventional Cardiology

## 2020-03-04 ENCOUNTER — Other Ambulatory Visit: Payer: Self-pay

## 2020-03-04 ENCOUNTER — Telehealth: Payer: Self-pay | Admitting: *Deleted

## 2020-03-04 ENCOUNTER — Other Ambulatory Visit: Payer: Medicare HMO

## 2020-03-04 DIAGNOSIS — R748 Abnormal levels of other serum enzymes: Secondary | ICD-10-CM

## 2020-03-04 DIAGNOSIS — Z20822 Contact with and (suspected) exposure to covid-19: Secondary | ICD-10-CM | POA: Diagnosis not present

## 2020-03-04 DIAGNOSIS — Z01812 Encounter for preprocedural laboratory examination: Secondary | ICD-10-CM | POA: Insufficient documentation

## 2020-03-04 DIAGNOSIS — Z79899 Other long term (current) drug therapy: Secondary | ICD-10-CM | POA: Insufficient documentation

## 2020-03-04 LAB — BASIC METABOLIC PANEL
Anion gap: 8 (ref 5–15)
BUN: 20 mg/dL (ref 8–23)
CO2: 27 mmol/L (ref 22–32)
Calcium: 9 mg/dL (ref 8.9–10.3)
Chloride: 101 mmol/L (ref 98–111)
Creatinine, Ser: 1.22 mg/dL — ABNORMAL HIGH (ref 0.44–1.00)
GFR, Estimated: 46 mL/min — ABNORMAL LOW (ref >60)
Glucose, Bld: 168 mg/dL — ABNORMAL HIGH (ref 70–99)
Potassium: 3.8 mmol/L (ref 3.5–5.1)
Sodium: 136 mmol/L (ref 135–145)

## 2020-03-04 LAB — SARS CORONAVIRUS 2 (TAT 6-24 HRS): SARS Coronavirus 2: NEGATIVE

## 2020-03-04 NOTE — Telephone Encounter (Signed)
See BMP results 03/04/20: GFR 46  PLAN:  - Since GFR 46 would bring in 5 hours early for hydration on day of cath.  - Hold bumetanide, ibuprofen, lisinopril day before and day of cath.  - Hold metformin 24 hours before and 48 hours after cath.  Richardson Dopp, PA-C    Plan reviewed with patient: -arrive 8:30 AM for hydration prior to procedure at 1:30 PM Dr Tamala Julian. -hold lisinopril/bumetamide day before and day of procedure  pt reports she is not currently taking ibuprofen -hold metformin 24 hours before and 48 hours after cath -except hold medications, may take morning medications, including plavix and aspirin, before she leaves to go to the hospital AM of procedure. -cannot drive home and needs responsible adult with her first 24 hours home.    Pt is aware this is change in arrival and procedure time to accommodate cath schedule and pre-procedure hydration. Pt knows to call if any questions.

## 2020-03-05 NOTE — Telephone Encounter (Signed)
Thank you! Richardson Dopp, PA-C    03/05/2020 2:20 PM

## 2020-03-07 ENCOUNTER — Ambulatory Visit (HOSPITAL_COMMUNITY)
Admission: RE | Admit: 2020-03-07 | Discharge: 2020-03-08 | Disposition: A | Discharge status: Home / Self Care | Payer: Medicare HMO | Attending: Cardiology | Admitting: Cardiology

## 2020-03-07 ENCOUNTER — Encounter (HOSPITAL_COMMUNITY): Payer: Self-pay | Admitting: Cardiology

## 2020-03-07 ENCOUNTER — Encounter (HOSPITAL_COMMUNITY)
Admission: RE | Disposition: A | Discharge status: Home / Self Care | Payer: Medicare HMO | Source: Home / Self Care | Attending: Cardiology

## 2020-03-07 DIAGNOSIS — Z7984 Long term (current) use of oral hypoglycemic drugs: Secondary | ICD-10-CM | POA: Insufficient documentation

## 2020-03-07 DIAGNOSIS — E1165 Type 2 diabetes mellitus with hyperglycemia: Secondary | ICD-10-CM | POA: Diagnosis present

## 2020-03-07 DIAGNOSIS — Z87891 Personal history of nicotine dependence: Secondary | ICD-10-CM | POA: Insufficient documentation

## 2020-03-07 DIAGNOSIS — E782 Mixed hyperlipidemia: Secondary | ICD-10-CM | POA: Insufficient documentation

## 2020-03-07 DIAGNOSIS — E785 Hyperlipidemia, unspecified: Secondary | ICD-10-CM | POA: Diagnosis present

## 2020-03-07 DIAGNOSIS — I5042 Chronic combined systolic (congestive) and diastolic (congestive) heart failure: Secondary | ICD-10-CM | POA: Diagnosis not present

## 2020-03-07 DIAGNOSIS — I2510 Atherosclerotic heart disease of native coronary artery without angina pectoris: Secondary | ICD-10-CM | POA: Diagnosis not present

## 2020-03-07 DIAGNOSIS — Z7901 Long term (current) use of anticoagulants: Secondary | ICD-10-CM | POA: Diagnosis not present

## 2020-03-07 DIAGNOSIS — IMO0002 Reserved for concepts with insufficient information to code with codable children: Secondary | ICD-10-CM | POA: Diagnosis present

## 2020-03-07 DIAGNOSIS — E119 Type 2 diabetes mellitus without complications: Secondary | ICD-10-CM | POA: Insufficient documentation

## 2020-03-07 DIAGNOSIS — I214 Non-ST elevation (NSTEMI) myocardial infarction: Secondary | ICD-10-CM | POA: Diagnosis present

## 2020-03-07 DIAGNOSIS — I5020 Unspecified systolic (congestive) heart failure: Secondary | ICD-10-CM

## 2020-03-07 DIAGNOSIS — Z79899 Other long term (current) drug therapy: Secondary | ICD-10-CM | POA: Insufficient documentation

## 2020-03-07 DIAGNOSIS — Z7982 Long term (current) use of aspirin: Secondary | ICD-10-CM | POA: Insufficient documentation

## 2020-03-07 DIAGNOSIS — I110 Hypertensive heart disease with heart failure: Secondary | ICD-10-CM | POA: Insufficient documentation

## 2020-03-07 DIAGNOSIS — Z955 Presence of coronary angioplasty implant and graft: Secondary | ICD-10-CM

## 2020-03-07 DIAGNOSIS — I5043 Acute on chronic combined systolic (congestive) and diastolic (congestive) heart failure: Secondary | ICD-10-CM

## 2020-03-07 HISTORY — PX: CORONARY STENT INTERVENTION: CATH118234

## 2020-03-07 HISTORY — PX: INTRAVASCULAR ULTRASOUND/IVUS: CATH118244

## 2020-03-07 HISTORY — PX: LEFT HEART CATH AND CORONARY ANGIOGRAPHY: CATH118249

## 2020-03-07 HISTORY — DX: Acute on chronic combined systolic (congestive) and diastolic (congestive) heart failure: I50.43

## 2020-03-07 LAB — GLUCOSE, CAPILLARY
Glucose-Capillary: 87 mg/dL (ref 70–99)
Glucose-Capillary: 57 mg/dL — ABNORMAL LOW (ref 70–99)
Glucose-Capillary: 60 mg/dL — ABNORMAL LOW (ref 70–99)
Glucose-Capillary: 94 mg/dL (ref 70–99)
Glucose-Capillary: 126 mg/dL — ABNORMAL HIGH (ref 70–99)
Glucose-Capillary: 98 mg/dL (ref 70–99)

## 2020-03-07 LAB — POCT ACTIVATED CLOTTING TIME
Activated Clotting Time: 285 seconds
Activated Clotting Time: 398 seconds

## 2020-03-07 SURGERY — LEFT HEART CATH AND CORONARY ANGIOGRAPHY
Anesthesia: LOCAL

## 2020-03-07 MED ORDER — LIDOCAINE HCL (PF) 1 % IJ SOLN
INTRAMUSCULAR | Status: DC | PRN
  Administered 2020-03-07: 2 mL

## 2020-03-07 MED ORDER — ACETAMINOPHEN 325 MG PO TABS: 650.0000 mg | ORAL_TABLET | ORAL | Status: DC | PRN

## 2020-03-07 MED ORDER — VITAMIN C 250 MG PO TABS
250.0000 mg | ORAL_TABLET | Freq: Every day | ORAL | Status: DC
  Administered 2020-03-08: 250 mg via ORAL
  Filled 2020-03-07 (×2): qty 1

## 2020-03-07 MED ORDER — CLOPIDOGREL BISULFATE 75 MG PO TABS
75.0000 mg | ORAL_TABLET | Freq: Every day | ORAL | Status: DC
  Administered 2020-03-08: 75 mg via ORAL
  Filled 2020-03-07: qty 1

## 2020-03-07 MED ORDER — NITROGLYCERIN 0.4 MG SL SUBL: 0.4000 mg | SUBLINGUAL_TABLET | SUBLINGUAL | Status: DC | PRN

## 2020-03-07 MED ORDER — ADULT MULTIVITAMIN W/MINERALS CH
1.0000 | ORAL_TABLET | Freq: Every day | ORAL | Status: DC
  Administered 2020-03-08: 1 via ORAL
  Filled 2020-03-07: qty 1

## 2020-03-07 MED ORDER — MIDAZOLAM HCL 2 MG/2ML IJ SOLN
INTRAMUSCULAR | Status: AC
  Filled 2020-03-07: qty 2

## 2020-03-07 MED ORDER — ASPIRIN EC 81 MG PO TBEC
81.0000 mg | DELAYED_RELEASE_TABLET | Freq: Every day | ORAL | Status: DC
Start: 2020-03-07 — End: 2020-03-07

## 2020-03-07 MED ORDER — IOHEXOL 350 MG/ML SOLN
INTRAVENOUS | Status: DC | PRN
  Administered 2020-03-07: 160 mL via INTRA_ARTERIAL

## 2020-03-07 MED ORDER — NITROGLYCERIN 1 MG/10 ML FOR IR/CATH LAB
INTRA_ARTERIAL | Status: DC | PRN
  Administered 2020-03-07 (×3): 200 ug via INTRACORONARY

## 2020-03-07 MED ORDER — SODIUM CHLORIDE 0.9 % IV SOLN
250.0000 mL | INTRAVENOUS | Status: DC | PRN
Start: 2020-03-07 — End: 2020-03-08

## 2020-03-07 MED ORDER — MIDAZOLAM HCL 2 MG/2ML IJ SOLN
INTRAMUSCULAR | Status: DC | PRN
  Administered 2020-03-07 (×3): 1 mg via INTRAVENOUS

## 2020-03-07 MED ORDER — MELATONIN 5 MG PO TABS
5.0000 mg | ORAL_TABLET | Freq: Every day | ORAL | Status: DC
  Administered 2020-03-07: 5 mg via ORAL
  Filled 2020-03-07: qty 1

## 2020-03-07 MED ORDER — APOAEQUORIN 20 MG PO CAPS: ORAL_CAPSULE | Freq: Every day | ORAL | Status: DC

## 2020-03-07 MED ORDER — CLOPIDOGREL BISULFATE 75 MG PO TABS: 75.0000 mg | ORAL_TABLET | Freq: Every day | ORAL | Status: DC

## 2020-03-07 MED ORDER — HEPARIN (PORCINE) IN NACL 1000-0.9 UT/500ML-% IV SOLN
INTRAVENOUS | Status: DC | PRN
  Administered 2020-03-07 (×2): 500 mL

## 2020-03-07 MED ORDER — HYDRALAZINE HCL 20 MG/ML IJ SOLN: 10.0000 mg | INTRAMUSCULAR | Status: AC | PRN

## 2020-03-07 MED ORDER — VERAPAMIL HCL 2.5 MG/ML IV SOLN
INTRAVENOUS | Status: AC
  Filled 2020-03-07: qty 2

## 2020-03-07 MED ORDER — VERAPAMIL HCL 2.5 MG/ML IV SOLN
INTRAVENOUS | Status: DC | PRN
  Administered 2020-03-07: 10 mL via INTRA_ARTERIAL

## 2020-03-07 MED ORDER — SODIUM CHLORIDE 0.9 % WEIGHT BASED INFUSION
1.0000 mL/kg/h | INTRAVENOUS | Status: AC
  Administered 2020-03-07: 1 mL/kg/h via INTRAVENOUS

## 2020-03-07 MED ORDER — METOPROLOL TARTRATE 25 MG PO TABS
25.0000 mg | ORAL_TABLET | Freq: Two times a day (BID) | ORAL | Status: DC
  Filled 2020-03-07 (×2): qty 1

## 2020-03-07 MED ORDER — FENTANYL CITRATE (PF) 100 MCG/2ML IJ SOLN
INTRAMUSCULAR | Status: AC
  Filled 2020-03-07: qty 2

## 2020-03-07 MED ORDER — HEPARIN SODIUM (PORCINE) 1000 UNIT/ML IJ SOLN
INTRAMUSCULAR | Status: DC | PRN
  Administered 2020-03-07 (×2): 3500 [IU] via INTRAVENOUS

## 2020-03-07 MED ORDER — HEPARIN (PORCINE) IN NACL 1000-0.9 UT/500ML-% IV SOLN
INTRAVENOUS | Status: AC
  Filled 2020-03-07: qty 1500

## 2020-03-07 MED ORDER — POTASSIUM 99 MG PO TABS: 99.0000 mg | ORAL_TABLET | Freq: Every day | ORAL | Status: DC

## 2020-03-07 MED ORDER — ATORVASTATIN CALCIUM 80 MG PO TABS
80.0000 mg | ORAL_TABLET | Freq: Every evening | ORAL | Status: DC
  Administered 2020-03-07: 80 mg via ORAL
  Filled 2020-03-07: qty 1

## 2020-03-07 MED ORDER — SODIUM CHLORIDE 0.9% FLUSH
3.0000 mL | INTRAVENOUS | Status: DC | PRN
  Administered 2020-03-07: 3 mL via INTRAVENOUS

## 2020-03-07 MED ORDER — SODIUM CHLORIDE 0.9% FLUSH
3.0000 mL | Freq: Two times a day (BID) | INTRAVENOUS | Status: DC
  Administered 2020-03-08: 3 mL via INTRAVENOUS

## 2020-03-07 MED ORDER — SODIUM CHLORIDE 0.9 % WEIGHT BASED INFUSION: 1.0000 mL/kg/h | INTRAVENOUS | Status: DC

## 2020-03-07 MED ORDER — FENTANYL CITRATE (PF) 100 MCG/2ML IJ SOLN
INTRAMUSCULAR | Status: DC | PRN
  Administered 2020-03-07 (×3): 25 ug via INTRAVENOUS

## 2020-03-07 MED ORDER — HEPARIN SODIUM (PORCINE) 1000 UNIT/ML IJ SOLN
INTRAMUSCULAR | Status: AC
  Filled 2020-03-07: qty 1

## 2020-03-07 MED ORDER — LABETALOL HCL 5 MG/ML IV SOLN: 10.0000 mg | INTRAVENOUS | Status: AC | PRN

## 2020-03-07 MED ORDER — METOPROLOL TARTRATE 25 MG PO TABS: 25.0000 mg | ORAL_TABLET | Freq: Two times a day (BID) | ORAL | Status: DC

## 2020-03-07 MED ORDER — ASPIRIN EC 81 MG PO TBEC
81.0000 mg | DELAYED_RELEASE_TABLET | Freq: Every day | ORAL | Status: DC
  Administered 2020-03-08: 81 mg via ORAL
  Filled 2020-03-07: qty 1

## 2020-03-07 MED ORDER — LISINOPRIL 5 MG PO TABS
5.0000 mg | ORAL_TABLET | Freq: Every day | ORAL | Status: DC
  Administered 2020-03-08: 5 mg via ORAL
  Filled 2020-03-07: qty 1

## 2020-03-07 MED ORDER — SODIUM CHLORIDE 0.9 % WEIGHT BASED INFUSION
3.0000 mL/kg/h | INTRAVENOUS | Status: DC
  Administered 2020-03-07: 3 mL/kg/h via INTRAVENOUS

## 2020-03-07 MED ORDER — LIDOCAINE HCL (PF) 1 % IJ SOLN
INTRAMUSCULAR | Status: AC
  Filled 2020-03-07: qty 30

## 2020-03-07 MED ORDER — NITROGLYCERIN 1 MG/10 ML FOR IR/CATH LAB
INTRA_ARTERIAL | Status: AC
  Filled 2020-03-07: qty 10

## 2020-03-07 MED ORDER — PANTOPRAZOLE SODIUM 40 MG PO TBEC
40.0000 mg | DELAYED_RELEASE_TABLET | Freq: Every day | ORAL | Status: DC
  Administered 2020-03-08: 40 mg via ORAL
  Filled 2020-03-07: qty 1

## 2020-03-07 MED ORDER — ALUM & MAG HYDROXIDE-SIMETH 200-200-20 MG/5ML PO SUSP: 15.0000 mL | Freq: Four times a day (QID) | ORAL | Status: DC | PRN

## 2020-03-07 MED ORDER — ASPIRIN 81 MG PO CHEW: 81.0000 mg | CHEWABLE_TABLET | ORAL | Status: DC

## 2020-03-07 SURGICAL SUPPLY — 19 items
BAG SNAP BAND KOVER 36X36 (MISCELLANEOUS) ×2 IMPLANT
BALLN SAPPHIRE 2.0X15 (BALLOONS) ×2
BALLOON SAPPHIRE 2.0X15 (BALLOONS) ×1 IMPLANT
CATH 5FR JL3.5 JR4 ANG PIG MP (CATHETERS) ×2 IMPLANT
CATH DRAGONFLY OPSTAR (CATHETERS) ×2 IMPLANT
CATH VISTA GUIDE 6FR JR4 (CATHETERS) ×2 IMPLANT
COVER DOME SNAP 22 D (MISCELLANEOUS) ×2 IMPLANT
DEVICE RAD TR BAND REGULAR (VASCULAR PRODUCTS) ×2 IMPLANT
GLIDESHEATH SLEND SS 6F .021 (SHEATH) ×2 IMPLANT
GUIDEWIRE INQWIRE 1.5J.035X260 (WIRE) ×1 IMPLANT
INQWIRE 1.5J .035X260CM (WIRE) ×2
KIT ENCORE 26 ADVANTAGE (KITS) ×2 IMPLANT
KIT HEART LEFT (KITS) ×2 IMPLANT
PACK CARDIAC CATHETERIZATION (CUSTOM PROCEDURE TRAY) ×2 IMPLANT
STENT RESOLUTE ONYX 3.5X30 (Permanent Stent) ×2 IMPLANT
SYR MEDRAD MARK 7 150ML (SYRINGE) ×2 IMPLANT
TRANSDUCER W/STOPCOCK (MISCELLANEOUS) ×2 IMPLANT
TUBING CIL FLEX 10 FLL-RA (TUBING) ×2 IMPLANT
WIRE ASAHI PROWATER 180CM (WIRE) ×4 IMPLANT

## 2020-03-07 NOTE — Progress Notes (Signed)
PHARMACIST - PHYSICIAN ORDER COMMUNICATION  CONCERNING: P&T Medication Policy on Herbal Medications  DESCRIPTION:  This patient's orders for:  Apoaequorin, potassium 99 mg (2.53 meq) have been noted.  These products are classified as an "herbal" or natural product. Due to a lack of definitive safety studies or FDA approval, nonstandard manufacturing practices, plus the potential risk of unknown drug-drug interactions while on inpatient medications, the Pharmacy and Therapeutics Committee does not permit the use of "herbal" or natural products of this type within [facility].   ACTION TAKEN: The pharmacy department is unable to verify these orders at this time and your patient has been informed of this safety policy. Please reevaluate patient's clinical condition at discharge and address if the herbal or natural product(s) should be resumed at that time.

## 2020-03-07 NOTE — Progress Notes (Signed)
Pt bsl checked and resulted at 57, regular lemon lime soda with added sugar given, pt states she is asymptomatic, tolerated well, will recheck bsl in approximately 15 minutes

## 2020-03-07 NOTE — Progress Notes (Signed)
bsl rechecked and resulted at 94, pt remains stable, oriented, safety maintained

## 2020-03-07 NOTE — Progress Notes (Signed)
bsl rechecked and resulted at 60, more regular soda, teddy grahams, and graham crackers with peanut butter given, pt remains alert and oriented, safety maintained

## 2020-03-07 NOTE — Progress Notes (Signed)
TR BAND REMOVAL  LOCATION:  right radial  DEFLATED PER PROTOCOL:  Yes.    TIME BAND OFF / DRESSING APPLIED:   2300   SITE UPON ARRIVAL:   Level 0  SITE AFTER BAND REMOVAL:  Level 0  CIRCULATION SENSATION AND MOVEMENT:  Within Normal Limits  Yes.    COMMENTS:  Scant ooze.

## 2020-03-07 NOTE — Interval H&P Note (Signed)
History and Physical Interval Note:  03/07/2020 11:10 AM  Marissa Schneider  has presented today for surgery, with the diagnosis of cad.  The various methods of treatment have been discussed with the patient and family. After consideration of risks, benefits and other options for treatment, the patient has consented to  Procedure(s): LEFT HEART CATH AND CORONARY ANGIOGRAPHY (N/A) as a surgical intervention.  The patient's history has been reviewed, patient examined, no change in status, stable for surgery.  I have reviewed the patient's chart and labs.  Questions were answered to the patient's satisfaction.    Cath Lab Visit (complete for each Cath Lab visit)  Clinical Evaluation Leading to the Procedure:   ACS: Yes.    Non-ACS:    Anginal Classification: CCS III  Anti-ischemic medical therapy: Maximal Therapy (2 or more classes of medications)  Non-Invasive Test Results: High-risk stress test findings: cardiac mortality >3%/year  Prior CABG: No previous CABG       Marissa Schneider [facility] 03/07/2020 11:10 AM

## 2020-03-08 ENCOUNTER — Ambulatory Visit (HOSPITAL_BASED_OUTPATIENT_CLINIC_OR_DEPARTMENT_OTHER): Payer: Medicare HMO

## 2020-03-08 ENCOUNTER — Encounter (HOSPITAL_COMMUNITY): Payer: Self-pay | Admitting: Cardiology

## 2020-03-08 ENCOUNTER — Other Ambulatory Visit: Payer: Self-pay

## 2020-03-08 ENCOUNTER — Other Ambulatory Visit: Payer: Self-pay | Admitting: Cardiology

## 2020-03-08 DIAGNOSIS — I5042 Chronic combined systolic (congestive) and diastolic (congestive) heart failure: Secondary | ICD-10-CM | POA: Diagnosis not present

## 2020-03-08 DIAGNOSIS — I5043 Acute on chronic combined systolic (congestive) and diastolic (congestive) heart failure: Secondary | ICD-10-CM

## 2020-03-08 DIAGNOSIS — E782 Mixed hyperlipidemia: Secondary | ICD-10-CM | POA: Diagnosis not present

## 2020-03-08 DIAGNOSIS — I2510 Atherosclerotic heart disease of native coronary artery without angina pectoris: Secondary | ICD-10-CM | POA: Diagnosis not present

## 2020-03-08 DIAGNOSIS — I110 Hypertensive heart disease with heart failure: Secondary | ICD-10-CM | POA: Diagnosis not present

## 2020-03-08 DIAGNOSIS — I214 Non-ST elevation (NSTEMI) myocardial infarction: Secondary | ICD-10-CM | POA: Diagnosis not present

## 2020-03-08 LAB — BASIC METABOLIC PANEL
Anion gap: 8 (ref 5–15)
BUN: 20 mg/dL (ref 8–23)
CO2: 28 mmol/L (ref 22–32)
Calcium: 9 mg/dL (ref 8.9–10.3)
Chloride: 105 mmol/L (ref 98–111)
Creatinine, Ser: 1.06 mg/dL — ABNORMAL HIGH (ref 0.44–1.00)
GFR, Estimated: 54 mL/min — ABNORMAL LOW (ref >60)
Glucose, Bld: 90 mg/dL (ref 70–99)
Potassium: 4.1 mmol/L (ref 3.5–5.1)
Sodium: 141 mmol/L (ref 135–145)

## 2020-03-08 LAB — CBC
HCT: 29.1 % — ABNORMAL LOW (ref 36.0–46.0)
Hemoglobin: 9.2 g/dL — ABNORMAL LOW (ref 12.0–15.0)
MCH: 26.9 pg (ref 26.0–34.0)
MCHC: 31.6 g/dL (ref 30.0–36.0)
MCV: 85.1 fL (ref 80.0–100.0)
Platelets: 154 10*3/uL (ref 150–400)
RBC: 3.42 MIL/uL — ABNORMAL LOW (ref 3.87–5.11)
RDW: 15.1 % (ref 11.5–15.5)
WBC: 4.8 10*3/uL (ref 4.0–10.5)
nRBC: 0 % (ref 0.0–0.2)

## 2020-03-08 LAB — ECHOCARDIOGRAM COMPLETE
Area-P 1/2: 3.99 cm2
Height: 70.5 in
S' Lateral: 3.4 cm
Weight: 2352 oz

## 2020-03-08 LAB — GLUCOSE, CAPILLARY
Glucose-Capillary: 91 mg/dL (ref 70–99)
Glucose-Capillary: 78 mg/dL (ref 70–99)

## 2020-03-08 MED ORDER — METOPROLOL SUCCINATE ER 25 MG PO TB24
25.0000 mg | ORAL_TABLET | Freq: Every day | ORAL | Status: DC
  Administered 2020-03-08: 25 mg via ORAL
  Filled 2020-03-08: qty 1

## 2020-03-08 MED ORDER — METOPROLOL SUCCINATE ER 25 MG PO TB24: 25.0000 mg | ORAL_TABLET | Freq: Every day | ORAL | 11 refills | Status: DC

## 2020-03-08 MED FILL — METOPROLOL SUCCINATE ER 25: 25 | 30 days supply | Qty: 30 | Fill #0

## 2020-03-08 NOTE — Discharge Summary (Addendum)
Discharge Summary    Patient ID: Marissa Schneider MRN: [id number]; DOB: [birth year]  Admit date: 03/07/2020 Discharge date: 03/08/2020  PCP:  Michell Heinrich, [facility]  Cardiologist:  Carlyle Dolly, MD  Advanced Practice Provider:  No care team member to display Electrophysiologist:  None  [id number]   Discharge Diagnoses    Principal Problem:   Non-ST elevation (NSTEMI) myocardial infarction [facility]) Active Problems:   CAD (coronary artery disease)   Hyperlipidemia   Type II diabetes mellitus, uncontrolled ([place])   Acute on chronic combined systolic and diastolic CHF (congestive heart failure) ([place])   NSTEMI (non-ST elevated myocardial infarction) ([place])  Diagnostic Studies/Procedures    Cath: 03/07/20   There is moderate left ventricular systolic dysfunction.  The left ventricular ejection fraction is 35-45% by visual estimate.  LV end diastolic pressure is normal.  Mid RCA lesion is 85% stenosed.  A drug-eluting stent was successfully placed using a STENT RESOLUTE ONYX 3.5X30.  Post intervention, there is a 0% residual stenosis.  Mid RCA to Dist RCA lesion is 35% stenosed.  Previously placed Prox LAD to Mid LAD stent (unknown type) is widely patent.  Mid Cx lesion is 100% stenosed.   1. Severe 2 vessel obstructive CAD    - widely patent LAD stent    - 100% mid LCx occlusion. Very mild right to left collaterals.    - long 85% mid RCA 2. Moderate LV dysfunction. EF estimated at 35-40%. EDP is normal 3. Successful PCI of the mid RCA with OCT guidance and DES x 1.  Plan: would treat residual disease in LCx medically. DAPT for one year. Optimize medical therapy for LV dysfunction. Risk factor modification. Will observe overnight. Anticipate DC in am.  Diagnostic Dominance: Right    Intervention     Echo: 03/08/20  IMPRESSIONS    1. Left ventricular ejection fraction, by estimation, is 50%. The left  ventricle has low  normal function. The left ventricle demonstrates  regional wall motion abnormalities (see scoring diagram/findings for  description). Left ventricular diastolic  parameters are consistent with Grade I diastolic dysfunction (impaired  relaxation). There is mild hypokinesis of the left ventricular, basal-mid  inferolateral wall.  2. Right ventricular systolic function is normal. The right ventricular  size is normal. There is normal pulmonary artery systolic pressure. The  estimated right ventricular systolic pressure is 08.1 mmHg.  3. The mitral valve is abnormal. Mild mitral valve regurgitation.  4. The aortic valve is tricuspid. Aortic valve regurgitation is not  visualized.  5. The inferior vena cava is normal in size with greater than 50%  respiratory variability, suggesting right atrial pressure of 3 mmHg.   Comparison(s): No prior Echocardiogram.  _____________   History of Present Illness     Marissa Schneider is a 75 y.o. female with PMH of STEMI with PCI of the LAD '14, HTN, HLD, DM who presented to the office for follow up. Marissa Schneider was last seen by Dr. Harl Bowie in 12/21.  She called the office regarding abnormal Myoview done during admit in 11/21 to [facility] in [place].  She wanted to discuss proceeding with cardiac catheterization and is seen for evaluation.    After her last visit with Dr. Harl Bowie, she was admitted again at [facility] in [place].  She did have some paperwork from that admission which was reviewed in the office. The records indicated she had a non-STEMI and was treated for acute heart failure.  She was placed  on clopidogrel, lisinopril and bumetanide.  She had an allergic reaction to furosemide.  She declined cardiac catheterization during that admission and was asked to follow-up here.  She just ran out of her medications in the last couple of days prior to office visit.  Since discharge she has done well without chest discomfort, shortness of breath, orthopnea, leg  edema or syncope.    Records were reviewed and she was set up for outpatient cardiac cath.  Hospital Course     Underwent cardiac cath noted above with severe 2v obstructive CAD. Noted widely patent LAD stent, 100% mLcx lesion with right to left collaterals, and 85% mRCA lesion which was treated with PCI/DES x1 with OCT guidance. Plan to continue on DAPT with ASA/plavix for at least one year. EF noted at 35-40% on cath. Follow up echo showed an EF of 50%. Metoprolol was switched to Toprol XL 25mg  daily. She was continued on home medications without further adjustment. Initial thought was to transition to Entresto/add spiro but with follow up echo with normal EF. Worked well with cardiac rehab without recurrent chest pain.   General: Well developed, well nourished, female appearing in no acute distress. Head: Normocephalic, atraumatic.  Neck: Supple without bruits, JVD. Lungs:  Resp regular and unlabored, CTA. Heart: RRR, S1, S2, no S3, S4, or murmur; no rub. Abdomen: Soft, non-tender, non-distended with normoactive bowel sounds. No hepatomegaly. No rebound/guarding. No obvious abdominal masses. Extremities: No clubbing, cyanosis, edema. Distal pedal pulses are 2+ bilaterally. Right radial cath site stable without bruising or hematoma Neuro: Alert and oriented X 3. Moves all extremities spontaneously. Psych: Normal affect.  Did the patient have an acute coronary syndrome (MI, NSTEMI, STEMI, etc) this admission?:  No                               Did the patient have a percutaneous coronary intervention (stent / angioplasty)?:  Yes.     Cath/PCI Registry Performance & Quality Measures: 1. Aspirin prescribed? - Yes 2. ADP Receptor Inhibitor (Plavix/Clopidogrel, Brilinta/Ticagrelor or Effient/Prasugrel) prescribed (includes medically managed patients)? - Yes 3. High Intensity Statin (Lipitor 40-80mg  or Crestor 20-40mg ) prescribed? - Yes 4. For EF <40%, was ACEI/ARB prescribed? - Yes 5. For EF  <40%, Aldosterone Antagonist (Spironolactone or Eplerenone) prescribed? - No - Reason:  blood pressures soft 6. Cardiac Rehab Phase II ordered? - Yes       _____________  Discharge Vitals Blood pressure (!) 118/49, pulse 70, temperature 98.1 F (36.7 C), temperature source Oral, resp. rate 18, height 5' 10.5" (1.791 m), weight 66.7 kg, SpO2 98 %.  Filed Weights   03/07/20 0749  Weight: 66.7 kg    Labs & Radiologic Studies    CBC Recent Labs    03/08/20 0056  WBC 4.8  HGB 9.2*  HCT 29.1*  MCV 85.1  PLT 902   Basic Metabolic Panel Recent Labs    03/08/20 0056  NA 141  K 4.1  CL 105  CO2 28  GLUCOSE 90  BUN 20  CREATININE 1.06*  CALCIUM 9.0   Liver Function Tests No results for input(s): AST, ALT, ALKPHOS, BILITOT, PROT, ALBUMIN in the last 72 hours. No results for input(s): LIPASE, AMYLASE in the last 72 hours. High Sensitivity Troponin:   No results for input(s): TROPONINIHS in the last 720 hours.  BNP Invalid input(s): POCBNP D-Dimer No results for input(s): DDIMER in the last 72 hours. Hemoglobin A1C No  results for input(s): HGBA1C in the last 72 hours. Fasting Lipid Panel No results for input(s): CHOL, HDL, LDLCALC, TRIG, CHOLHDL, LDLDIRECT in the last 72 hours. Thyroid Function Tests No results for input(s): TSH, T4TOTAL, T3FREE, THYROIDAB in the last 72 hours.  Invalid input(s): FREET3 _____________  CARDIAC CATHETERIZATION  Result Date: 03/07/2020  There is moderate left ventricular systolic dysfunction.  The left ventricular ejection fraction is 35-45% by visual estimate.  LV end diastolic pressure is normal.  Mid RCA lesion is 85% stenosed.  A drug-eluting stent was successfully placed using a STENT RESOLUTE ONYX 3.5X30.  Post intervention, there is a 0% residual stenosis.  Mid RCA to Dist RCA lesion is 35% stenosed.  Previously placed Prox LAD to Mid LAD stent (unknown type) is widely patent.  Mid Cx lesion is 100% stenosed.  1. Severe 2  vessel obstructive CAD    - widely patent LAD stent    - 100% mid LCx occlusion. Very mild right to left collaterals.    - long 85% mid RCA 2. Moderate LV dysfunction. EF estimated at 35-40%. EDP is normal 3. Successful PCI of the mid RCA with OCT guidance and DES x 1. Plan: would treat residual disease in LCx medically. DAPT for one year. Optimize medical therapy for LV dysfunction. Risk factor modification. Will observe overnight. Anticipate DC in am.   ECHOCARDIOGRAM COMPLETE  Result Date: 03/08/2020    ECHOCARDIOGRAM REPORT   Patient Name:   ALEXCIS BICKING Date of Exam: 03/08/2020 Medical Rec #:  [id number]           Height:       70.5 in Accession #:    [id number]          Weight:       147.0 lb Date of Birth:  10-21-1945          BSA:          1.841 m Patient Age:    84 years            BP:           118/49 mmHg Patient Gender: F                   HR:           86 bpm. Exam Location:  Inpatient Procedure: 2D Echo, Cardiac Doppler and Color Doppler Indications:    CAD of Native Vessel I25.10  History:        Patient has prior history of Echocardiogram examinations, most                 recent 08/05/2014. CHF, CAD and Previous Myocardial Infarction;                 Risk Factors:Dyslipidemia, Diabetes and Former Smoker.  Sonographer:    Tiffany Dance Referring Phys: [place]  1. Left ventricular ejection fraction, by estimation, is 50%. The left ventricle has low normal function. The left ventricle demonstrates regional wall motion abnormalities (see scoring diagram/findings for description). Left ventricular diastolic parameters are consistent with Grade I diastolic dysfunction (impaired relaxation). There is mild hypokinesis of the left ventricular, basal-mid inferolateral wall.  2. Right ventricular systolic function is normal. The right ventricular size is normal. There is normal pulmonary artery systolic pressure. The estimated right ventricular systolic pressure is 58.5  mmHg.  3. The mitral valve is abnormal. Mild mitral valve regurgitation.  4. The aortic valve is tricuspid. Aortic valve  regurgitation is not visualized.  5. The inferior vena cava is normal in size with greater than 50% respiratory variability, suggesting right atrial pressure of 3 mmHg. Comparison(s): No prior Echocardiogram. FINDINGS  Left Ventricle: Left ventricular ejection fraction, by estimation, is 50%. The left ventricle has low normal function. The left ventricle demonstrates regional wall motion abnormalities. Mild hypokinesis of the left ventricular, basal-mid inferolateral wall. The left ventricular internal cavity size was normal in size. There is no left ventricular hypertrophy. Left ventricular diastolic parameters are consistent with Grade I diastolic dysfunction (impaired relaxation). Indeterminate filling pressures. Right Ventricle: The right ventricular size is normal. No increase in right ventricular wall thickness. Right ventricular systolic function is normal. There is normal pulmonary artery systolic pressure. The tricuspid regurgitant velocity is 2.43 m/s, and  with an assumed right atrial pressure of 3 mmHg, the estimated right ventricular systolic pressure is 34.7 mmHg. Left Atrium: Left atrial size was normal in size. Right Atrium: Right atrial size was normal in size. Pericardium: There is no evidence of pericardial effusion. Mitral Valve: The mitral valve is abnormal. There is mild thickening of the mitral valve leaflet(s). Mild mitral valve regurgitation. Tricuspid Valve: The tricuspid valve is grossly normal. Tricuspid valve regurgitation is trivial. Aortic Valve: The aortic valve is tricuspid. Aortic valve regurgitation is not visualized. Pulmonic Valve: The pulmonic valve was grossly normal. Pulmonic valve regurgitation is trivial. Aorta: The aortic root and ascending aorta are structurally normal, with no evidence of dilitation. Venous: The inferior vena cava is normal in size with  greater than 50% respiratory variability, suggesting right atrial pressure of 3 mmHg. IAS/Shunts: No atrial level shunt detected by color flow Doppler.  LEFT VENTRICLE PLAX 2D LVIDd:         4.50 cm  Diastology LVIDs:         3.40 cm  LV e' medial:    8.16 cm/s LV PW:         1.00 cm  LV E/e' medial:  11.1 LV IVS:        0.90 cm  LV e' lateral:   9.68 cm/s LVOT diam:     2.00 cm  LV E/e' lateral: 9.3 LV SV:         72 LV SV Index:   39 LVOT Area:     3.14 cm  RIGHT VENTRICLE             IVC RV Basal diam:  2.60 cm     IVC diam: 2.00 cm RV S prime:     16.30 cm/s TAPSE (M-mode): 1.8 cm LEFT ATRIUM             Index       RIGHT ATRIUM          Index LA diam:        3.90 cm 2.12 cm/m  RA Area:     8.93 cm LA Vol (A2C):   67.0 ml 36.39 ml/m RA Volume:   16.00 ml 8.69 ml/m LA Vol (A4C):   51.5 ml 27.97 ml/m LA Biplane Vol: 58.7 ml 31.88 ml/m  AORTIC VALVE LVOT Vmax:   94.20 cm/s LVOT Vmean:  63.000 cm/s LVOT VTI:    0.228 m  AORTA Ao Root diam: 2.80 cm Ao Asc diam:  2.70 cm MITRAL VALVE                TRICUSPID VALVE MV Area (PHT): 3.99 cm     TR Peak grad:   23.6 mmHg  MV Decel Time: 190 msec     TR Vmax:        243.00 cm/s MV E velocity: 90.30 cm/s MV A velocity: 110.00 cm/s  SHUNTS MV E/A ratio:  0.82         Systemic VTI:  0.23 m                             Systemic Diam: 2.00 cm Lyman Bishop MD Electronically signed by Lyman Bishop MD Signature Date/Time: 03/08/2020/11:30:44 AM    Final    Disposition   Pt is being discharged home today in good condition.  Follow-up Plans & Appointments     Follow-up Information    Liliane Shi, PA-C Follow up on 03/27/2020.   Specialties: Cardiology, Physician Assistant Why: at 11:15am for your follow up appt Contact information: [address] [place] [id number] [phone]              Discharge Instructions    Amb Referral to Cardiac Rehabilitation   Complete by: As directed    Diagnosis: Coronary Stents   After initial evaluation and  assessments completed: Virtual Based Care may be provided alone or in conjunction with Phase 2 Cardiac Rehab based on patient barriers.: Yes   Call MD for:  difficulty breathing, headache or visual disturbances   Complete by: As directed    Call MD for:  persistant dizziness or light-headedness   Complete by: As directed    Call MD for:  redness, tenderness, or signs of infection (pain, swelling, redness, odor or green/yellow discharge around incision site)   Complete by: As directed    Diet - low sodium heart healthy   Complete by: As directed    Discharge instructions   Complete by: As directed    Radial Site Care Refer to this sheet in the next few weeks. These instructions provide you with information on caring for yourself after your procedure. Your caregiver may also give you more specific instructions. Your treatment has been planned according to current medical practices, but problems sometimes occur. Call your caregiver if you have any problems or questions after your procedure. HOME CARE INSTRUCTIONS You may shower the day after the procedure.Remove the bandage (dressing) and gently wash the site with plain soap and water.Gently pat the site dry.  Do not apply powder or lotion to the site.  Do not submerge the affected site in water for 3 to 5 days.  Inspect the site at least twice daily.  Do not flex or bend the affected arm for 24 hours.  No lifting over 5 pounds (2.3 kg) for 5 days after your procedure.  Do not drive home if you are discharged the same day of the procedure. Have someone else drive you.  You may drive 24 hours after the procedure unless otherwise instructed by your caregiver.  What to expect: Any bruising will usually fade within 1 to 2 weeks.  Blood that collects in the tissue (hematoma) may be painful to the touch. It should usually decrease in size and tenderness within 1 to 2 weeks.  SEEK IMMEDIATE MEDICAL CARE IF: You have unusual pain at the radial site.   You have redness, warmth, swelling, or pain at the radial site.  You have drainage (other than a small amount of blood on the dressing).  You have chills.  You have a fever or persistent symptoms for more than 72 hours.  You  have a fever and your symptoms suddenly get worse.  Your arm becomes pale, cool, tingly, or numb.  You have heavy bleeding from the site. Hold pressure on the site.   PLEASE DO NOT MISS ANY DOSES OF YOUR PLAVIX!!!!! Also keep a log of you blood pressures and bring back to your follow up appt. Please call the office with any questions.   Patients taking blood thinners should generally stay away from medicines like ibuprofen, Advil, Motrin, naproxen, and Aleve due to risk of stomach bleeding. You may take Tylenol as directed or talk to your primary doctor about alternatives.   PLEASE ENSURE THAT YOU DO NOT RUN OUT OF YOUR PLAVIX. This medication is very important to remain on for at least one year. IF you have issues obtaining this medication due to cost please CALL the office 3-5 business days prior to running out in order to prevent missing doses of this medication.   Increase activity slowly   Complete by: As directed      Discharge Medications   Allergies as of 03/08/2020      Reactions   Cheese Hives   On chin   Lasix [furosemide] Hives   Aspirin Rash      Medication List    STOP taking these medications   bumetanide 1 MG tablet Commonly known as: Bumex   ibuprofen 200 MG tablet Commonly known as: ADVIL   metoprolol tartrate 25 MG tablet Commonly known as: LOPRESSOR     TAKE these medications   Accu-Chek Aviva Plus test strip Generic drug: glucose blood   Accu-Chek Softclix Lancets lancets   alum & mag hydroxide-simeth [id number] MG/5ML suspension Commonly known as: MAALOX/MYLANTA Take 15 mLs by mouth every 6 (six) hours as needed for indigestion or heartburn.   aspirin EC 81 MG tablet Take 81 mg by mouth daily.   atorvastatin 80 MG  tablet Commonly known as: LIPITOR TAKE 1 TABLET AT 6 PM What changed:   how much to take  how to take this  when to take this  additional instructions   clopidogrel 75 MG tablet Commonly known as: PLAVIX Take 1 tablet (75 mg total) by mouth daily.   diphenhydrAMINE 25 MG tablet Commonly known as: BENADRYL Take 50 mg by mouth daily as needed for allergies (congestion).   lisinopril 5 MG tablet Commonly known as: ZESTRIL Take 1 tablet (5 mg total) by mouth in the morning and at bedtime.   MegaRed Omega-3 Krill Oil 500 MG Caps Take 500 mg by mouth daily.   melatonin 5 MG Tabs Take 5 mg by mouth.   metFORMIN 500 MG tablet Commonly known as: GLUCOPHAGE Take 500 mg by mouth 2 (two) times daily with a meal.   metoprolol succinate 25 MG 24 hr tablet Commonly known as: TOPROL-XL Take 1 tablet (25 mg total) by mouth daily.   multivitamin with minerals Tabs tablet Take 1 tablet by mouth daily.   nitroGLYCERIN 0.4 MG SL tablet Commonly known as: NITROSTAT Place 1 tablet (0.4 mg total) under the tongue every 5 (five) minutes as needed for chest pain.   pantoprazole 40 MG tablet Commonly known as: PROTONIX Take 1 tablet (40 mg total) by mouth daily.   Potassium 99 MG Tabs Take 99 mg by mouth daily.   PREVAGEN EXTRA STRENGTH PO Take 1 tablet by mouth daily.   Visine Advanced Relief 0.05-0.1-1-1 % Soln Generic drug: Tetrahydroz-Dextran-PEG-Povid Place 1 drop into both eyes 3 (three) times daily as needed (irritation).  VITAMIN C PO Take 1 tablet by mouth daily.   VITAMIN D3 PO Take 1 tablet by mouth daily.         Outstanding Labs/Studies   N/a   Duration of Discharge Encounter   Greater than 30 minutes including physician time.  Signed, Reino Bellis, NP 03/08/2020, 12:46 PM  I have examined the patient and reviewed assessment and plan and discussed with patient.  Agree with above as stated.    GEN: Well nourished, well developed, in no acute  distress  HEENT: normal  Neck: no JVD, carotid bruits, or masses Cardiac: RRR; no murmurs, rubs, or gallops,no edema  Respiratory:  clear to auscultation bilaterally, normal work of breathing GI: soft, nontender, nondistended,  MS: no deformity or atrophy ; right wrist without hematoma Skin: warm and dry, no rash Neuro:  Strength and sensation are intact Psych: euthymic mood, full affect  Echo images appear to show normal LV function.  Continue DAPT with aspirin and clopidogrel.  Brilinta was very expensive in the past.   Larae Grooms

## 2020-03-08 NOTE — Discharge Instructions (Signed)
Radial Site Care  This sheet gives you information about how to care for yourself after your procedure. Your health care provider may also give you more specific instructions. If you have problems or questions, contact your health care provider. What can I expect after the procedure? After the procedure, it is common to have:  Bruising and tenderness at the catheter insertion area. Follow these instructions at home: Medicines  Take over-the-counter and prescription medicines only as told by your health care provider. Insertion site care  Follow instructions from your health care provider about how to take care of your insertion site. Make sure you: ? Wash your hands with soap and water before you change your bandage (dressing). If soap and water are not available, use hand sanitizer. ? Change your dressing as told by your health care provider. ? Leave stitches (sutures), skin glue, or adhesive strips in place. These skin closures may need to stay in place for 2 weeks or longer. If adhesive strip edges start to loosen and curl up, you may trim the loose edges. Do not remove adhesive strips completely unless your health care provider tells you to do that.  Check your insertion site every day for signs of infection. Check for: ? Redness, swelling, or pain. ? Fluid or blood. ? Pus or a bad smell. ? Warmth.  Do not take baths, swim, or use a hot tub until your health care provider approves.  You may shower 24-48 hours after the procedure, or as directed by your health care provider. ? Remove the dressing and gently wash the site with plain soap and water. ? Pat the area dry with a clean towel. ? Do not rub the site. That could cause bleeding.  Do not apply powder or lotion to the site. Activity  For 24 hours after the procedure, or as directed by your health care provider: ? Do not flex or bend the affected arm. ? Do not push or pull heavy objects with the affected arm. ? Do not drive  yourself home from the hospital or clinic. You may drive 24 hours after the procedure unless your health care provider tells you not to. ? Do not operate machinery or power tools.  Do not lift anything that is heavier than 10 lb (4.5 kg), or the limit that you are told, until your health care provider says that it is safe.  Ask your health care provider when it is okay to: ? Return to work or school. ? Resume usual physical activities or sports. ? Resume sexual activity.   General instructions  If the catheter site starts to bleed, raise your arm and put firm pressure on the site. If the bleeding does not stop, get help right away. This is a medical emergency.  If you went home on the same day as your procedure, a responsible adult should be with you for the first 24 hours after you arrive home.  Keep all follow-up visits as told by your health care provider. This is important. Contact a health care provider if:  You have a fever.  You have redness, swelling, or yellow drainage around your insertion site. Get help right away if:  You have unusual pain at the radial site.  The catheter insertion area swells very fast.  The insertion area is bleeding, and the bleeding does not stop when you hold steady pressure on the area.  Your arm or hand becomes pale, cool, tingly, or numb. These symptoms may represent a serious   problem that is an emergency. Do not wait to see if the symptoms will go away. Get medical help right away. Call your local emergency services (911 in the [place] Do not drive yourself to the hospital. Summary  After the procedure, it is common to have bruising and tenderness at the site.  Follow instructions from your health care provider about how to take care of your radial site wound. Check the wound every day for signs of infection.  Do not lift anything that is heavier than 10 lb (4.5 kg), or the limit that you are told, until your health care provider says that it  is safe. This information is not intended to replace advice given to you by your health care provider. Make sure you discuss any questions you have with your health care provider. Document Revised: 02/17/2017 Document Reviewed: 02/17/2017 Elsevier Patient Education  2021 [employer]  

## 2020-03-08 NOTE — Progress Notes (Signed)
CARDIAC REHAB PHASE I   Stent education completed with pt. Pt educated on importance of ASA and Plavix. Pt given heart healthy and diabetic diets. Reviewed site care, restrictions, and exercise guidelines. Will refer to CRP II [place] per pt request. Meds delivered to bedside. Anxious for d/c.  [id number] Rufina Falco, RN BSN 03/08/2020 10:56 AM

## 2020-03-08 NOTE — Plan of Care (Signed)
  Problem: Education: Goal: Knowledge of General Education information will improve Description: Including pain rating scale, medication(s)/side effects and non-pharmacologic comfort measures Outcome: Completed/Met   Problem: Education: Goal: Understanding of CV disease, CV risk reduction, and recovery process will improve Outcome: Completed/Met Goal: Individualized Educational Video(s) Outcome: Completed/Met   Problem: Activity: Goal: Ability to return to baseline activity level will improve Outcome: Completed/Met   Problem: Cardiovascular: Goal: Ability to achieve and maintain adequate cardiovascular perfusion will improve Outcome: Completed/Met Goal: Vascular access site(s) Level 0-1 will be maintained Outcome: Completed/Met   Problem: Health Behavior/Discharge Planning: Goal: Ability to safely manage health-related needs after discharge will improve Outcome: Completed/Met

## 2020-03-08 NOTE — Progress Notes (Signed)
  Echocardiogram 2D Echocardiogram has been performed.  Yanni Quiroa G Arneshia Ade 03/08/2020, 10:18 AM

## 2020-03-26 NOTE — Progress Notes (Addendum)
Cardiology Office Note:    Date:  03/27/2020   ID:  Myya Meenach, DOB 12/31/1945, MRN [id number]  PCP:  Addis, Daniel, [facility]  Cardiologist:  Carlyle Dolly, MD  Advanced Practice Provider:  No care team member to display Electrophysiologist:  None       Referring MD: Michell Heinrich, DO   Chief Complaint:  Hospitalization Follow-up (S/p PCI)    Patient Profile:    Marissa Schneider is a 75 y.o. female with:   Coronary artery disease  ? S/p anterior STEMI in 10/2012 >> PCI: DES to LAD ? Myoview ([facility] in [place]) 11/21: inf-lat scar with peri-infarct ischemia ? S/p DES to RCA in 02/2020; LCx chronically occluded   (HFpEF) heart failure with preserved ejection fraction   Hyperlipidemia   Hypertension   Diabetes mellitus   Prior CV studies: Echocardiogram 03/08/20 EF 50, Gr 1 DD, inf-lat HK, normal RVSF, RVSP 26.6  Cardiac catheterization 03/07/20 LAD stent patent LCx mid 100, R-L collats  RCA mid 85, 35 EF 35-45 PCI: 3.5 x 30 mm Resolute ONyx DES to [facility]   Myoview 12/12/19 ([place]) Inf, inf-lat, lat infarct with peri-infarct ischemia; EF 31  Echocardiogram 08/05/14 Mild focal basal septal LVH, EF 60-65, no rWMA, Gr 1 DD, mild MR   Cardiac catheterization 11/06/12 LM dist 20-30 LAD mid 99; D1 ost 50 LCx ost 50; OM1 50 RCA mid 50; PDA/PLA bifurcational 50-70 EF 45 PCI:  2.5 x 24 mm Promus DES to LAD   Records from [facility] in [place], [place] Echocardiogram 12/12/2019: EF 50-55, GRII DD PASP 52, moderate MR, moderate to severe TR  History of Present Illness:    Marissa Schneider was last seen in 02/2020 after a 2nd admission to [facility] in [place].  Her most recent admission was for a NSTEMI c/b decompensated heart failure. She was stable at her f/u.  We set her up for a cardiac catheterization for further evaluation.  Cardiac catheterization demonstrated a patent LAD stent, occluded LCx with R-L  collaterals and high grade disease in the RCA which was tx with a DES.  Her EF was 35-45.  But on f/u echocardiogram her EF was low normal at 50.  She returns for f/u.  She is here alone.  Since discharge in the hospital, she has not had chest pain, shortness of breath, syncope, orthopnea, leg edema.  She does have a rash on her left arm.  It is pruritic.      Past Medical History:  Diagnosis Date  . Acute on chronic combined systolic and diastolic CHF (congestive heart failure) ([place]) 03/07/2020  . CAD (coronary artery disease)    a. 10/2012 Ant STEMI/PCI: LM 20-30d, LAD 50/37m (2.5x24 Promus Premier DES), D1 50ost, LCX 50ost, OM1 50/50, RCa 26m/d, 50-70d @ bifurcation of PDA/PLA, EF 45%.  . Diabetes mellitus without complication ([place])   . Hyperlipidemia   . Myocardial infarction ([place])   . Tobacco abuse   . Type II diabetes mellitus, uncontrolled ([place]) 11/08/2012    Current Medications: Current Meds  Medication Sig  . ACCU-CHEK AVIVA PLUS test strip   . Accu-Chek Softclix Lancets lancets   . alum & mag hydroxide-simeth (MAALOX/MYLANTA) 200-200-20 MG/5ML suspension Take 15 mLs by mouth every 6 (six) hours as needed for indigestion or heartburn.  Marland Kitchen Apoaequorin (PREVAGEN EXTRA STRENGTH PO) Take 1 tablet by mouth daily.  . Ascorbic Acid (VITAMIN C PO) Take 1 tablet by mouth daily.  Marland Kitchen  aspirin EC 81 MG tablet Take 81 mg by mouth daily.  Marland Kitchen atorvastatin (LIPITOR) 80 MG tablet TAKE 1 TABLET AT 6 PM (Patient taking differently: Take 80 mg by mouth every evening.)  . Cholecalciferol (VITAMIN D3 PO) Take 1 tablet by mouth daily.  . clopidogrel (PLAVIX) 75 MG tablet Take 1 tablet (75 mg total) by mouth daily.  . diphenhydrAMINE (BENADRYL) 25 MG tablet Take 50 mg by mouth daily as needed for allergies (congestion).   Marland Kitchen lisinopril (ZESTRIL) 5 MG tablet Take 1 tablet (5 mg total) by mouth in the morning and at bedtime.  Marnee Spring Omega-3 Krill Oil 500 MG CAPS Take 500 mg by mouth daily.  . melatonin 5 MG  TABS Take 5 mg by mouth.  . metFORMIN (GLUCOPHAGE) 500 MG tablet Take 500 mg by mouth 2 (two) times daily with a meal.  . metoprolol succinate (TOPROL-XL) 25 MG 24 hr tablet Take 1 tablet (25 mg total) by mouth daily.  . Multiple Vitamin (MULTIVITAMIN WITH MINERALS) TABS tablet Take 1 tablet by mouth daily.  . nitroGLYCERIN (NITROSTAT) 0.4 MG SL tablet Place 1 tablet (0.4 mg total) under the tongue every 5 (five) minutes as needed for chest pain.  . pantoprazole (PROTONIX) 40 MG tablet Take 1 tablet (40 mg total) by mouth daily.  . Potassium 99 MG TABS Take 99 mg by mouth daily.  Sallye Lat (VISINE ADVANCED RELIEF) 0.05-0.1-1-1 % SOLN Place 1 drop into both eyes 3 (three) times daily as needed (irritation).     Allergies:   Cheese, Lasix [furosemide], and Aspirin   Social History   Tobacco Use  . Smoking status: Former Smoker    Packs/day: 1.00    Years: 50.00    Pack years: 50.00    Types: Cigarettes    Quit date: 11/05/2012    Years since quitting: 7.3  . Smokeless tobacco: Never Used  Vaping Use  . Vaping Use: Never used  Substance Use Topics  . Alcohol use: Yes    Alcohol/week: 0.0 standard drinks    Comment: Drinks at social events  . Drug use: No     Family Hx: The patient's family history includes Diabetes in her maternal aunt.  ROS   EKGs/Labs/Other Test Reviewed:    EKG:  EKG is   ordered today.  The ekg ordered today demonstrates normal sinus rhythm, heart rate 68, normal axis, nonspecific ST-T wave changes, QTC 448  Recent Labs: 03/08/2020: BUN 20; Creatinine, Ser 1.06; Hemoglobin 9.2; Platelets 154; Potassium 4.1; Sodium 141   Recent Lipid Panel Lab Results  Component Value Date/Time   CHOL 150 04/19/2013 01:10 PM   TRIG 118 04/19/2013 01:10 PM   HDL 46 04/19/2013 01:10 PM   CHOLHDL 3.3 04/19/2013 01:10 PM   LDLCALC 80 04/19/2013 01:10 PM      Risk Assessment/Calculations:      Physical Exam:    VS:  BP (!) 142/84   Pulse  78   Ht 5' 8.5" (1.74 m)   Wt 149 lb 9.6 oz (67.9 kg)   SpO2 94%   BMI 22.42 kg/m     Wt Readings from Last 3 Encounters:  03/27/20 149 lb 9.6 oz (67.9 kg)  03/07/20 147 lb (66.7 kg)  02/28/20 142 lb (64.4 kg)     Constitutional:      Appearance: Healthy appearance. Not in distress.  Neck:     Vascular: No JVR. JVD normal.  Pulmonary:     Effort: Pulmonary effort is normal.  Breath sounds: No wheezing. No rales.  Cardiovascular:     Normal rate. Regular rhythm. Normal S1. Normal S2.     Murmurs: There is no murmur.     Comments: Right wrist without hematoma Edema:    Peripheral edema absent.  Abdominal:     Palpations: Abdomen is soft. There is no hepatomegaly.  Skin:    General: Skin is warm and dry.     Findings: Rash (erythematous area on L forearm with +/- excoriation ) present.  Neurological:     Mental Status: Alert and oriented to person, place and time.     Cranial Nerves: Cranial nerves are intact.       ASSESSMENT & PLAN:    1. Coronary artery disease involving native coronary artery of native heart without angina pectoris History of anterior STEMI in 2014 treated with DES to the LAD.  Most recently, she has undergone a DES to the RCA.  She has a chronically occluded LCx which is managed medically.  She is doing well without anginal symptoms.  We discussed the importance of continuing dual antiplatelet therapy for 1 year post PCI.  Continue aspirin, clopidogrel, atorvastatin, lisinopril, metoprolol succinate.  I have encouraged her to start cardiac rehab.  Follow-up with Dr. Harl Bowie in 3 months.  2. Chronic heart failure with preserved ejection fraction (HCC) EF 50 by echocardiogram 02/2020.  NYHA II.  Volume status is stable.  She is not currently on diuretic therapy.  3. Essential hypertension Blood pressure somewhat elevated.  She notes blood pressures at home are optimal.  I have asked her to continue to monitor this and let us know if her blood pressures  >130/80.  4. Mixed hyperlipidemia Continue high intensity statin therapy.  Obtain recent lipid panel from primary care. Addendum: Labs from PCP dated 11/23/2019 personally reviewed: TC 135, HDL 45, triglycerides 99, LDL 72.  If LDL remains >70, consider adding ezetimibe.  5. Rash She has a rash on her left arm.  I do not think this is related to clopidogrel.  I have asked her to monitor this.  I have asked her to follow-up with primary care.  She can use Benadryl and hydrocortisone cream.  If her rash becomes more widespread, we may have to switch her from clopidogrel to ticagrelor.  She did have difficulty affording ticagrelor before.     Dispo:  Return in about 3 months (around 06/27/2020) for Dr. Harl Bowie.   Medication Adjustments/Labs and Tests Ordered: Current medicines are reviewed at length with the patient today.  Concerns regarding medicines are outlined above.  Tests Ordered: No orders of the defined types were placed in this encounter.  Medication Changes: No orders of the defined types were placed in this encounter.   Signed, Richardson Dopp, PA-C  03/27/2020 5:16 PM    [facility], [place], [place]  [address] Phone: [phone]; Fax: [phone]

## 2020-03-27 ENCOUNTER — Other Ambulatory Visit: Payer: Self-pay

## 2020-03-27 ENCOUNTER — Telehealth: Payer: Self-pay | Admitting: *Deleted

## 2020-03-27 ENCOUNTER — Ambulatory Visit: Payer: Medicare HMO | Admitting: Physician Assistant

## 2020-03-27 ENCOUNTER — Encounter: Payer: Self-pay | Admitting: Physician Assistant

## 2020-03-27 VITALS — BP 142/84 | HR 78 | Ht 68.5 in | Wt 149.6 lb

## 2020-03-27 DIAGNOSIS — I2510 Atherosclerotic heart disease of native coronary artery without angina pectoris: Secondary | ICD-10-CM | POA: Diagnosis not present

## 2020-03-27 DIAGNOSIS — I5032 Chronic diastolic (congestive) heart failure: Secondary | ICD-10-CM | POA: Diagnosis not present

## 2020-03-27 DIAGNOSIS — I10 Essential (primary) hypertension: Secondary | ICD-10-CM | POA: Diagnosis not present

## 2020-03-27 DIAGNOSIS — R21 Rash and other nonspecific skin eruption: Secondary | ICD-10-CM

## 2020-03-27 DIAGNOSIS — E782 Mixed hyperlipidemia: Secondary | ICD-10-CM | POA: Diagnosis not present

## 2020-03-27 NOTE — Patient Instructions (Addendum)
Medication Instructions:  Your physician recommends that you continue on your current medications as directed. Please refer to the Current Medication list given to you today.  *If you need a refill on your cardiac medications before your next appointment, please call your pharmacy*   Lab Work: I will get lab work from [employer].   If you have labs (blood work) drawn today and your tests are completely normal, you will receive your results only by: Marland Kitchen MyChart Message (if you have MyChart) OR . A paper copy in the mail If you have any lab test that is abnormal or we need to change your treatment, we will call you to review the results.   Testing/Procedures: -None   Follow-Up: At [facility], you and your health needs are our priority.  As part of our continuing mission to provide you with exceptional heart care, we have created designated Provider Care Teams.  These Care Teams include your primary Cardiologist (physician) and Advanced Practice Providers (APPs -  Physician Assistants and Nurse Practitioners) who all work together to provide you with the care you need, when you need it.  We recommend signing up for the patient portal called "MyChart".  Sign up information is provided on this After Visit Summary.  MyChart is used to connect with patients for Virtual Visits (Telemedicine).  Patients are able to view lab/test results, encounter notes, upcoming appointments, etc.  Non-urgent messages can be sent to your provider as well.   To learn more about what you can do with MyChart, go to [email or web address].    Your next appointment:   3 month(s)  The format for your next appointment:   In Person  Provider:   Carlyle Dolly, MD   Other Instructions See Primary Care Doctor about rash. Ok to use benadryl or hydrocortisone creme for rash. Keep an eye on BP call if greater than 130/80.

## 2020-03-27 NOTE — Telephone Encounter (Signed)
Sw/ Sonia Baller at PCP # [phone] will fax pt's recent labs from October.

## 2020-03-28 NOTE — Addendum Note (Signed)
Addended by: Berlinda Last on: 03/28/2020 08:17 AM   Modules accepted: Orders

## 2020-04-03 ENCOUNTER — Telehealth: Payer: Self-pay | Admitting: Cardiology

## 2020-04-03 MED ORDER — METOPROLOL SUCCINATE ER 25 MG PO TB24: 25.0000 mg | ORAL_TABLET | Freq: Every day | ORAL | 3 refills | Status: DC

## 2020-04-03 NOTE — Telephone Encounter (Signed)
Refill complete 

## 2020-04-03 NOTE — Telephone Encounter (Signed)
Error

## 2020-04-03 NOTE — Telephone Encounter (Signed)
New message     *STAT* If patient is at the pharmacy, call can be transferred to refill team.   1. Which medications need to be refilled? (please list name of each medication and dose if known) metoprolol succinate (TOPROL-XL) 25 MG 24 hr tablet  2. Which pharmacy/location (including street and city if local pharmacy) is medication to be sent to? [facility] [address] [place]   3. Do they need a 30 day or 90 day supply?  [place]

## 2020-05-14 ENCOUNTER — Ambulatory Visit: Payer: Medicare PPO | Admitting: Cardiology

## 2020-06-03 ENCOUNTER — Other Ambulatory Visit: Payer: Self-pay

## 2020-06-03 MED ORDER — CLOPIDOGREL BISULFATE 75 MG PO TABS: 75.0000 mg | ORAL_TABLET | Freq: Every day | ORAL | 3 refills | Status: DC

## 2020-06-03 MED ORDER — NITROGLYCERIN 0.4 MG SL SUBL: 0.4000 mg | SUBLINGUAL_TABLET | SUBLINGUAL | 2 refills | Status: DC | PRN

## 2020-06-04 ENCOUNTER — Telehealth: Payer: Self-pay

## 2020-06-04 MED ORDER — METOPROLOL SUCCINATE ER 25 MG PO TB24: 25.0000 mg | ORAL_TABLET | Freq: Every day | ORAL | 3 refills | Status: DC

## 2020-06-04 NOTE — Telephone Encounter (Signed)
Medication refill request for Metoprolol Succinate ER 25 mg tablets approved and sent to [place].

## 2020-06-05 ENCOUNTER — Other Ambulatory Visit: Payer: Self-pay

## 2020-06-05 NOTE — Telephone Encounter (Signed)
This is a [facility] pt.  °

## 2020-06-06 MED ORDER — LISINOPRIL 5 MG PO TABS: 5.0000 mg | ORAL_TABLET | Freq: Two times a day (BID) | ORAL | 1 refills | Status: DC

## 2020-06-13 ENCOUNTER — Other Ambulatory Visit: Payer: Self-pay

## 2020-06-13 MED ORDER — LISINOPRIL 5 MG PO TABS: 5.0000 mg | ORAL_TABLET | Freq: Two times a day (BID) | ORAL | 3 refills | Status: DC

## 2020-07-10 ENCOUNTER — Other Ambulatory Visit: Payer: Self-pay

## 2020-07-10 ENCOUNTER — Ambulatory Visit: Payer: Medicare HMO | Admitting: Cardiology

## 2020-07-10 ENCOUNTER — Encounter: Payer: Self-pay | Admitting: Cardiology

## 2020-07-10 VITALS — BP 111/54 | HR 66 | Ht 69.0 in | Wt 142.0 lb

## 2020-07-10 DIAGNOSIS — I10 Essential (primary) hypertension: Secondary | ICD-10-CM

## 2020-07-10 DIAGNOSIS — E782 Mixed hyperlipidemia: Secondary | ICD-10-CM

## 2020-07-10 DIAGNOSIS — I2510 Atherosclerotic heart disease of native coronary artery without angina pectoris: Secondary | ICD-10-CM

## 2020-07-10 NOTE — Progress Notes (Signed)
Clinical Summary [name] is a 75 y.o.femaleseen today for follow up of the following medical problems.   1. CAD   - patient admitted to [facility] 10/12-10/14/14 with anterior wall STEMI, LVEF by LV gram 45% with anterior wall hypokinesis. LVEF 55% by echo with apical hypokinesis  - s/p DES to mid LAD   - 07/2014 nuclear stress without clear ischemia  - has had chronic nonspecific intermittent chest pains over the years  - recently admitted to [facility] - stress test during that admit suggested basal inferior infarct with peri-infarct ischemia      -admit 02/2020 with chest pain - 02/2020 cath as reported below, received DES to RCA. Residual disease treated medically.  - 02/2020 echo LVEF 50%, grade I dd  - no recent chest pain. No SOB/DOE - compliant with meds     2. HL   - Jan 2018 TC 167 TG 127 HDL 45 LDL 97 - labs followed by pcp, with recent labs earlier this month - she is compliant with statin   Labs from PCP dated 11/23/2019 personally reviewed: TC 135, HDL 45, triglycerides 99, LDL 72. - compliant with meds   3. DM2   - followed by pcp     4. HTN -she is compliant with meds     SH: had covid vaccine x 2. Has 5 kids, one has passed. 20+grandchildren   Past Medical History:  Diagnosis Date   Acute on chronic combined systolic and diastolic CHF (congestive heart failure) ([place]) 03/07/2020   CAD (coronary artery disease)    a. 10/2012 Ant STEMI/PCI: LM 20-30d, LAD 50/69m (2.5x24 Promus Premier DES), D1 50ost, LCX 50ost, OM1 50/50, RCa 24m/d, 50-70d @ bifurcation of PDA/PLA, EF 45%.   Diabetes mellitus without complication (HCC)    Hyperlipidemia    Myocardial infarction ([place])    Tobacco abuse    Type II diabetes mellitus, uncontrolled ([place]) 11/08/2012     Allergies  Allergen Reactions   Cheese Hives    On chin   Lasix [Furosemide] Hives   Aspirin Rash     Current Outpatient Medications  Medication Sig Dispense Refill   ACCU-CHEK AVIVA  PLUS test strip      Accu-Chek Softclix Lancets lancets      alum & mag hydroxide-simeth (MAALOX/MYLANTA) 200-200-20 MG/5ML suspension Take 15 mLs by mouth every 6 (six) hours as needed for indigestion or heartburn.     Apoaequorin (PREVAGEN EXTRA STRENGTH PO) Take 1 tablet by mouth daily.     Ascorbic Acid (VITAMIN C PO) Take 1 tablet by mouth daily.     aspirin EC 81 MG tablet Take 81 mg by mouth daily.     atorvastatin (LIPITOR) 80 MG tablet TAKE 1 TABLET AT 6 PM (Patient taking differently: Take 80 mg by mouth every evening.) 90 tablet 3   Cholecalciferol (VITAMIN D3 PO) Take 1 tablet by mouth daily.     clopidogrel (PLAVIX) 75 MG tablet Take 1 tablet (75 mg total) by mouth daily. 90 tablet 3   diphenhydrAMINE (BENADRYL) 25 MG tablet Take 50 mg by mouth daily as needed for allergies (congestion).      lisinopril (ZESTRIL) 5 MG tablet Take 1 tablet (5 mg total) by mouth in the morning and at bedtime. 180 tablet 3   MegaRed Omega-3 Krill Oil 500 MG CAPS Take 500 mg by mouth daily.     melatonin 5 MG TABS Take 5 mg by mouth.     metFORMIN (GLUCOPHAGE)  500 MG tablet Take 500 mg by mouth 2 (two) times daily with a meal.     metoprolol succinate (TOPROL-XL) 25 MG 24 hr tablet TAKE 1 TABLET (25 MG TOTAL) BY MOUTH DAILY. 30 tablet 11   metoprolol succinate (TOPROL-XL) 25 MG 24 hr tablet Take 1 tablet (25 mg total) by mouth daily. 90 tablet 3   Multiple Vitamin (MULTIVITAMIN WITH MINERALS) TABS tablet Take 1 tablet by mouth daily.     nitroGLYCERIN (NITROSTAT) 0.4 MG SL tablet Place 1 tablet (0.4 mg total) under the tongue every 5 (five) minutes as needed for chest pain. 75 tablet 2   pantoprazole (PROTONIX) 40 MG tablet Take 1 tablet (40 mg total) by mouth daily. 90 tablet 3   Potassium 99 MG TABS Take 99 mg by mouth daily.     Tetrahydroz-Dextran-PEG-Povid (VISINE ADVANCED RELIEF) 0.05-0.1-1-1 % SOLN Place 1 drop into both eyes 3 (three) times daily as needed (irritation).     No current  facility-administered medications for this visit.     Past Surgical History:  Procedure Laterality Date   APPENDECTOMY     CHOLECYSTECTOMY N/A 08/08/2014   Procedure: LAPAROSCOPIC CHOLECYSTECTOMY;  Surgeon: Aviva Signs, MD;  Location: [facility];  Service: General;  Laterality: N/A;   CORONARY STENT INTERVENTION N/A 03/07/2020   Procedure: CORONARY STENT INTERVENTION;  Surgeon: Martinique, Peter M, MD;  Location: [facility];  Service: Cardiovascular;  Laterality: N/A;  RCA    CORONARY STENT PLACEMENT     INTRAVASCULAR ULTRASOUND/IVUS N/A 03/07/2020   Procedure: Intravascular Ultrasound/IVUS;  Surgeon: Martinique, Peter M, MD;  Location: [facility];  Service: Cardiovascular;  Laterality: N/A;  OCT   LAPAROSCOPY WITH TUBAL LIGATION     LEFT HEART CATH AND CORONARY ANGIOGRAPHY N/A 03/07/2020   Procedure: LEFT HEART CATH AND CORONARY ANGIOGRAPHY;  Surgeon: Martinique, Peter M, MD;  Location: [place] [facility];  Service: Cardiovascular;  Laterality: N/A;   LEFT HEART CATHETERIZATION WITH CORONARY ANGIOGRAM N/A 11/06/2012   Procedure: LEFT HEART CATHETERIZATION WITH CORONARY ANGIOGRAM;  Surgeon: Blane Ohara, MD;  Location: [facility];  Service: Cardiovascular;  Laterality: N/A;   TUBAL LIGATION       Allergies  Allergen Reactions   Cheese Hives    On chin   Lasix [Furosemide] Hives   Aspirin Rash      Family History  Problem Relation Age of Onset   Diabetes Maternal Aunt      Social History [name] reports that she quit smoking about 7 years ago. Her smoking use included cigarettes. She has a 50.00 pack-year smoking history. She has never used smokeless tobacco. [name] reports current alcohol use.   Review of Systems CONSTITUTIONAL: No weight loss, fever, chills, weakness or fatigue.  HEENT: Eyes: No visual loss, blurred vision, double vision or yellow sclerae.No hearing loss, sneezing, congestion, runny nose or sore throat.  SKIN: No rash or itching.   CARDIOVASCULAR: per hpi RESPIRATORY: No shortness of breath, cough or sputum.  GASTROINTESTINAL: No anorexia, nausea, vomiting or diarrhea. No abdominal pain or blood.  GENITOURINARY: No burning on urination, no polyuria NEUROLOGICAL: No headache, dizziness, syncope, paralysis, ataxia, numbness or tingling in the extremities. No change in bowel or bladder control.  MUSCULOSKELETAL: No muscle, back pain, joint pain or stiffness.  LYMPHATICS: No enlarged nodes. No history of splenectomy.  PSYCHIATRIC: No history of depression or anxiety.  ENDOCRINOLOGIC: No reports of sweating, cold or heat intolerance. No polyuria or polydipsia.  Marland Kitchen  Physical Examination Today's Vitals   07/10/20 1041  BP: (!) 111/54  Pulse: 66  Weight: 142 lb (64.4 kg)  Height: 5\' 9"  (1.753 m)   Body mass index is 20.97 kg/m.  Gen: resting comfortably, no acute distress HEENT: no scleral icterus, pupils equal round and reactive, no palptable cervical adenopathy,  CV: RRR, no m/r/g, no jvd Resp: Clear to auscultation bilaterally GI: abdomen is soft, non-tender, non-distended, normal bowel sounds, no hepatosplenomegaly MSK: extremities are warm, no edema.  Skin: warm, no rash Neuro:  no focal deficits Psych: appropriate affect   Diagnostic Studies 11/06/12 Cath   Cardiac Catheterization and Percutaneous Coronary Intervention 10.12.2014   Hemodynamics:   AO 175/88   LV 177/16   Coronary angiography:   Coronary dominance: right   Left mainstem: Patent vessel with 20-30% distal LM stenosis   Left anterior descending (LAD): Proximal irregularity. There is severe disease in the mid-LAD. Diffuse 50% stenosis leading into a 99% mid-vessel stenosis. The first diagonal is large with 50% ostial stenosis and it originates at the proximal aspect of LAD plaque. The distal LAD is tortuous and widely patent as it wraps the LV apex.   **The mid-LAD was successfully stented using a 2.5x24 mm Promus Premier drug-eluting  stent.**   Left circumflex (LCx): 50% ostial stenosis, 50% stenosis of OM1 with sequential lesions   Right coronary artery (RCA): normal caliber, dominant vessel. There is diffuse disease noted. There is 50% mid-vessel stenosis and 50-70% bifurcational disease at the PDA/PLA origin.   Left ventriculography: Left ventricular systolic function is moderately depressed. There is hypokinesis of the distal anterior wall and anteroapex. The LVEF is estimated at 45%.   Final Conclusions: 1. Severe mid-LAD stenosis treated successfully with primary PCI using a DES   2. Moderate RCA and LCx stenosis   3. Moderate LV dysfunction 11/07/12 Echo: LVEF 55%, mild LVH, diastolic dysfunction without grade specified, hypokinesis apical anteroseptal and apical inferoseptal segments.   11/17/12 Clinic EKG   SR, normal axis, anteroseptal Q waves, diffuse T wave inversions       07/2014 Nuclear stress There was no ST segment deviation noted during stress. The study is normal. This is a low risk study. The left ventricular ejection fraction is mildly decreased (45-54%). There is a small mild intensity distal anterior wall defect at rest. Postinjection a similar but slightly more intense defect is noted. This wall segment has normal wall motion. Findings likely secondary to differences in breast positioning/soft tissue attenuation. Less likely area of small ischemia. Low risk findings overall.     Cath: 03/07/20   There is moderate left ventricular systolic dysfunction. The left ventricular ejection fraction is 35-45% by visual estimate. LV end diastolic pressure is normal. Mid RCA lesion is 85% stenosed. A drug-eluting stent was successfully placed using a STENT RESOLUTE ONYX 3.5X30. Post intervention, there is a 0% residual stenosis. Mid RCA to Dist RCA lesion is 35% stenosed. Previously placed Prox LAD to Mid LAD stent (unknown type) is widely patent. Mid Cx lesion is 100% stenosed.   1. Severe 2 vessel  obstructive CAD    - widely patent LAD stent    - 100% mid LCx occlusion. Very mild right to left collaterals.    - long 85% mid RCA 2. Moderate LV dysfunction. EF estimated at 35-40%. EDP is normal 3. Successful PCI of the mid RCA with OCT guidance and DES x 1.   Plan: would treat residual disease in LCx medically. DAPT for one year.  Optimize medical therapy for LV dysfunction. Risk factor modification. Will observe overnight. Anticipate DC in am.   Assessment and Plan  1. CAD   - denies any symptoms, continue current meds. DAPT until 02/2021   2. HL   -request labs from pcp, continue statin     3. HTN - at goal, continue current meds  F/u 6 months   Arnoldo Lenis, M.D.

## 2020-07-10 NOTE — Patient Instructions (Signed)
Medication Instructions:    Your physician recommends that you continue on your current medications as directed. Please refer to the Current Medication list given to you today. *If you need a refill on your cardiac medications before your next appointment, please call your pharmacy*   Lab Work:  [place]   If you have labs (blood work) drawn today and your tests are completely normal, you will receive your results only by: [place] (if you have MyChart) OR A paper copy in the mail If you have any lab test that is abnormal or we need to change your treatment, we will call you to review the results.   Testing/Procedures: .NONE ORDERED  TODAY     Follow-Up: At [facility], you and your health needs are our priority.  As part of our continuing mission to provide you with exceptional heart care, we have created designated Provider Care Teams.  These Care Teams include your primary Cardiologist (physician) and Advanced Practice Providers (APPs -  Physician Assistants and Nurse Practitioners) who all work together to provide you with the care you need, when you need it.  We recommend signing up for the patient portal called "MyChart".  Sign up information is provided on this After Visit Summary.  MyChart is used to connect with patients for Virtual Visits (Telemedicine).  Patients are able to view lab/test results, encounter notes, upcoming appointments, etc.  Non-urgent messages can be sent to your provider as well.   To learn more about what you can do with MyChart, go to [email or web address].    Your next appointment:   6 month(s)  The format for your next appointment:   In Person  Provider:   Carlyle Dolly, MD   Other Instructions

## 2020-12-11 ENCOUNTER — Telehealth: Payer: Self-pay | Admitting: Cardiology

## 2020-12-11 MED ORDER — CLOPIDOGREL BISULFATE 75 MG PO TABS: 75.0000 mg | ORAL_TABLET | Freq: Every day | ORAL | 0 refills | Status: DC

## 2020-12-11 MED ORDER — LISINOPRIL 5 MG PO TABS: 5.0000 mg | ORAL_TABLET | Freq: Two times a day (BID) | ORAL | 0 refills | Status: DC

## 2020-12-11 NOTE — Telephone Encounter (Signed)
*  STAT* If patient is at the pharmacy, call can be transferred to refill team.   1. Which medications need to be refilled? (please list name of each medication and dose if known)  clopidogrel (PLAVIX) 75 MG tablet lisinopril (ZESTRIL) 5 MG tablet  2. Which pharmacy/location (including street and city if local pharmacy) is medication to be sent to? [address], [place], [place].   3. Do they need a 30 day or 90 day supply? 60 day   If you can please let pharmacy know not to bill her insurance.

## 2020-12-11 NOTE — Telephone Encounter (Signed)
Completed. Sent message to pharmacy not to bill insurance per pt request.

## 2020-12-12 MED ORDER — CLOPIDOGREL BISULFATE 75 MG PO TABS: 75.0000 mg | ORAL_TABLET | Freq: Every day | ORAL | 0 refills | Status: DC

## 2020-12-12 MED ORDER — LISINOPRIL 5 MG PO TABS: 5.0000 mg | ORAL_TABLET | Freq: Two times a day (BID) | ORAL | 0 refills | Status: DC

## 2020-12-12 NOTE — Telephone Encounter (Signed)
Was sent to wrong pharmacy.  Please sent to [facility].  See note below.

## 2020-12-12 NOTE — Telephone Encounter (Signed)
Meds escribed to [facility] at [place]

## 2020-12-12 NOTE — Addendum Note (Signed)
Addended by: Levonne Hubert on: 12/12/2020 11:26 AM   Modules accepted: Orders

## 2021-01-28 ENCOUNTER — Other Ambulatory Visit: Payer: Self-pay

## 2021-01-28 MED ORDER — LISINOPRIL 5 MG PO TABS: 5.0000 mg | ORAL_TABLET | Freq: Two times a day (BID) | ORAL | 3 refills | Status: DC

## 2021-01-28 MED ORDER — PANTOPRAZOLE SODIUM 40 MG PO TBEC: 40.0000 mg | DELAYED_RELEASE_TABLET | Freq: Every day | ORAL | 3 refills | Status: DC

## 2021-01-28 MED ORDER — CLOPIDOGREL BISULFATE 75 MG PO TABS: 75.0000 mg | ORAL_TABLET | Freq: Every day | ORAL | 5 refills | Status: DC

## 2021-01-28 MED ORDER — ATORVASTATIN CALCIUM 80 MG PO TABS: ORAL_TABLET | ORAL | 3 refills | Status: DC

## 2021-01-28 MED ORDER — NITROGLYCERIN 0.4 MG SL SUBL: 0.4000 mg | SUBLINGUAL_TABLET | SUBLINGUAL | 3 refills | Status: DC | PRN

## 2021-01-28 NOTE — Telephone Encounter (Signed)
Spoke with patient who states she needs ninety day supply refills on her medication. She states she was taking 10mg  of Lisinopril and for some reason it was changed to 5mg  bid.   Please advise!

## 2021-02-25 ENCOUNTER — Encounter: Payer: Self-pay | Admitting: Cardiology

## 2021-03-07 ENCOUNTER — Encounter: Payer: Self-pay | Admitting: Cardiology

## 2021-03-07 ENCOUNTER — Ambulatory Visit: Payer: Medicare HMO | Admitting: Cardiology

## 2021-03-07 ENCOUNTER — Other Ambulatory Visit: Payer: Self-pay

## 2021-03-07 VITALS — BP 112/64 | HR 80 | Ht 70.0 in | Wt 141.0 lb

## 2021-03-07 DIAGNOSIS — I10 Essential (primary) hypertension: Secondary | ICD-10-CM

## 2021-03-07 DIAGNOSIS — I2510 Atherosclerotic heart disease of native coronary artery without angina pectoris: Secondary | ICD-10-CM | POA: Diagnosis not present

## 2021-03-07 DIAGNOSIS — E782 Mixed hyperlipidemia: Secondary | ICD-10-CM | POA: Diagnosis not present

## 2021-03-07 NOTE — Patient Instructions (Signed)
Medication Instructions:  STOP Plavix 75 mg tablets  Labwork: We have requested your most recent lab work from your PCP  Follow-Up: Follow up with Dr. Harl Bowie in 6 months.   Any Other Special Instructions Will Be Listed Below (If Applicable).     If you need a refill on your cardiac medications before your next appointment, please call your pharmacy.

## 2021-03-07 NOTE — Progress Notes (Signed)
Clinical Summary Ms. Bronkema is a 76 y.o.female seen today for follow up of the following medical problems.   1. CAD   - patient admitted to [facility] 10/12-10/14/14 with anterior wall STEMI, LVEF by LV gram 45% with anterior wall hypokinesis. LVEF 55% by echo with apical hypokinesis  - s/p DES to mid LAD   - 07/2014 nuclear stress without clear ischemia  - has had chronic nonspecific intermittent chest pains over the years   - recently admitted to [facility] - stress test during that admit suggested basal inferior infarct with peri-infarct ischemia         -admit 02/2020 with chest pain - 02/2020 cath as reported below, received DES to RCA. Residual disease treated medically.  - 02/2020 echo LVEF 50%, grade I dd   - no chest pain, no SOB/DOE.        2. HL   - Jan 2018 TC 167 TG 127 HDL 45 LDL 97 - labs followed by pcp, with recent labs earlier this month - she is compliant with statin   11/23/2019  TC 135, HDL 45, triglycerides 99, LDL 72. -she reports recent labs with pcp - compliant with atorvastatin    3. DM2   - followed by pcp     4. HTN -remains compliant with meds     Past Medical History:  Diagnosis Date   Acute on chronic combined systolic and diastolic CHF (congestive heart failure) ([place]) 03/07/2020   CAD (coronary artery disease)    a. 10/2012 Ant STEMI/PCI: LM 20-30d, LAD 50/44m (2.5x24 Promus Premier DES), D1 50ost, LCX 50ost, OM1 50/50, RCa 5m/d, 50-70d @ bifurcation of PDA/PLA, EF 45%.   Diabetes mellitus without complication (HCC)    Hyperlipidemia    Myocardial infarction ([place])    Tobacco abuse    Type II diabetes mellitus, uncontrolled ([place]) 11/08/2012     Allergies  Allergen Reactions   Cheese Hives    On chin   Lasix [Furosemide] Hives   Aspirin Rash     Current Outpatient Medications  Medication Sig Dispense Refill   ACCU-CHEK AVIVA PLUS test strip      Accu-Chek Softclix Lancets lancets      alum & mag hydroxide-simeth  (MAALOX/MYLANTA) 200-200-20 MG/5ML suspension Take 15 mLs by mouth every 6 (six) hours as needed for indigestion or heartburn.     Apoaequorin (PREVAGEN EXTRA STRENGTH PO) Take 1 tablet by mouth daily.     Ascorbic Acid (VITAMIN C PO) Take 1 tablet by mouth daily.     aspirin EC 81 MG tablet Take 81 mg by mouth daily.     atorvastatin (LIPITOR) 80 MG tablet TAKE 1 TABLET AT 6 PM 90 tablet 3   Cholecalciferol (VITAMIN D3 PO) Take 1 tablet by mouth daily.     clopidogrel (PLAVIX) 75 MG tablet Take 1 tablet (75 mg total) by mouth daily. 60 tablet 5   diphenhydrAMINE (BENADRYL) 25 MG tablet Take 50 mg by mouth daily as needed for allergies (congestion).      lisinopril (ZESTRIL) 5 MG tablet Take 1 tablet (5 mg total) by mouth in the morning and at bedtime. 180 tablet 3   MegaRed Omega-3 Krill Oil 500 MG CAPS Take 500 mg by mouth daily.     melatonin 5 MG TABS Take 5 mg by mouth.     metFORMIN (GLUCOPHAGE) 500 MG tablet Take 500 mg by mouth 2 (two) times daily with a meal.     metoprolol  succinate (TOPROL-XL) 25 MG 24 hr tablet TAKE 1 TABLET (25 MG TOTAL) BY MOUTH DAILY. 30 tablet 11   metoprolol succinate (TOPROL-XL) 25 MG 24 hr tablet Take 1 tablet (25 mg total) by mouth daily. 90 tablet 3   Multiple Vitamin (MULTIVITAMIN WITH MINERALS) TABS tablet Take 1 tablet by mouth daily.     nitroGLYCERIN (NITROSTAT) 0.4 MG SL tablet Place 1 tablet (0.4 mg total) under the tongue every 5 (five) minutes as needed for chest pain. 25 tablet 3   pantoprazole (PROTONIX) 40 MG tablet Take 1 tablet (40 mg total) by mouth daily. 90 tablet 3   Potassium 99 MG TABS Take 99 mg by mouth daily.     Tetrahydroz-Dextran-PEG-Povid (VISINE ADVANCED RELIEF) 0.05-0.1-1-1 % SOLN Place 1 drop into both eyes 3 (three) times daily as needed (irritation).     No current facility-administered medications for this visit.     Past Surgical History:  Procedure Laterality Date   APPENDECTOMY     CHOLECYSTECTOMY N/A 08/08/2014    Procedure: LAPAROSCOPIC CHOLECYSTECTOMY;  Surgeon: Aviva Signs, MD;  Location: [facility];  Service: General;  Laterality: N/A;   CORONARY STENT INTERVENTION N/A 03/07/2020   Procedure: CORONARY STENT INTERVENTION;  Surgeon: Martinique, Peter M, MD;  Location: [place] [facility];  Service: Cardiovascular;  Laterality: N/A;  RCA    CORONARY STENT PLACEMENT     INTRAVASCULAR ULTRASOUND/IVUS N/A 03/07/2020   Procedure: Intravascular Ultrasound/IVUS;  Surgeon: Martinique, Peter M, MD;  Location: [place] [facility];  Service: Cardiovascular;  Laterality: N/A;  OCT   LAPAROSCOPY WITH TUBAL LIGATION     LEFT HEART CATH AND CORONARY ANGIOGRAPHY N/A 03/07/2020   Procedure: LEFT HEART CATH AND CORONARY ANGIOGRAPHY;  Surgeon: Martinique, Peter M, MD;  Location: [place] [facility];  Service: Cardiovascular;  Laterality: N/A;   LEFT HEART CATHETERIZATION WITH CORONARY ANGIOGRAM N/A 11/06/2012   Procedure: LEFT HEART CATHETERIZATION WITH CORONARY ANGIOGRAM;  Surgeon: Blane Ohara, MD;  Location: [facility] C[facility] LAB;  Service: Cardiovascular;  Laterality: N/A;   TUBAL LIGATION       Allergies  Allergen Reactions   Cheese Hives    On chin   Lasix [Furosemide] Hives   Aspirin Rash      Family History  Problem Relation Age of Onset   Diabetes Maternal Aunt      Social History Ms. Frances reports that she quit smoking about 8 years ago. Her smoking use included cigarettes. She has a 50.00 pack-year smoking history. She has never used smokeless tobacco. Ms. Russett reports current alcohol use.   Review of Systems CONSTITUTIONAL: No weight loss, fever, chills, weakness or fatigue.  HEENT: Eyes: No visual loss, blurred vision, double vision or yellow sclerae.No hearing loss, sneezing, congestion, runny nose or sore throat.  SKIN: No rash or itching.  CARDIOVASCULAR: per hpi RESPIRATORY: No shortness of breath, cough or sputum.  GASTROINTESTINAL: No anorexia, nausea, vomiting or diarrhea. No abdominal pain or blood.   GENITOURINARY: No burning on urination, no polyuria NEUROLOGICAL: No headache, dizziness, syncope, paralysis, ataxia, numbness or tingling in the extremities. No change in bowel or bladder control.  MUSCULOSKELETAL: No muscle, back pain, joint pain or stiffness.  LYMPHATICS: No enlarged nodes. No history of splenectomy.  PSYCHIATRIC: No history of depression or anxiety.  ENDOCRINOLOGIC: No reports of sweating, cold or heat intolerance. No polyuria or polydipsia.  Marland Kitchen   Physical Examination Today's Vitals   03/07/21 0917  BP: 112/64  Pulse: 80  Weight: 141 lb (64  kg)  Height: 5\' 10"  (1.778 m)   Body mass index is 20.23 kg/m.  Gen: resting comfortably, no acute distress HEENT: no scleral icterus, pupils equal round and reactive, no palptable cervical adenopathy,  CV: RRR, no m/r/g no jvd Resp: Clear to auscultation bilaterally GI: abdomen is soft, non-tender, non-distended, normal bowel sounds, no hepatosplenomegaly MSK: extremities are warm, no edema.  Skin: warm, no rash Neuro:  no focal deficits Psych: appropriate affect   Diagnostic Studies 11/06/12 Cath   Cardiac Catheterization and Percutaneous Coronary Intervention 10.12.2014   Hemodynamics:   AO 175/88   LV 177/16   Coronary angiography:   Coronary dominance: right   Left mainstem: Patent vessel with 20-30% distal LM stenosis   Left anterior descending (LAD): Proximal irregularity. There is severe disease in the mid-LAD. Diffuse 50% stenosis leading into a 99% mid-vessel stenosis. The first diagonal is large with 50% ostial stenosis and it originates at the proximal aspect of LAD plaque. The distal LAD is tortuous and widely patent as it wraps the LV apex.   **The mid-LAD was successfully stented using a 2.5x24 mm Promus Premier drug-eluting stent.**   Left circumflex (LCx): 50% ostial stenosis, 50% stenosis of OM1 with sequential lesions   Right coronary artery (RCA): normal caliber, dominant vessel. There is diffuse  disease noted. There is 50% mid-vessel stenosis and 50-70% bifurcational disease at the PDA/PLA origin.   Left ventriculography: Left ventricular systolic function is moderately depressed. There is hypokinesis of the distal anterior wall and anteroapex. The LVEF is estimated at 45%.   Final Conclusions: 1. Severe mid-LAD stenosis treated successfully with primary PCI using a DES   2. Moderate RCA and LCx stenosis   3. Moderate LV dysfunction 11/07/12 Echo: LVEF 55%, mild LVH, diastolic dysfunction without grade specified, hypokinesis apical anteroseptal and apical inferoseptal segments.   11/17/12 Clinic EKG   SR, normal axis, anteroseptal Q waves, diffuse T wave inversions       07/2014 Nuclear stress There was no ST segment deviation noted during stress. The study is normal. This is a low risk study. The left ventricular ejection fraction is mildly decreased (45-54%). There is a small mild intensity distal anterior wall defect at rest. Postinjection a similar but slightly more intense defect is noted. This wall segment has normal wall motion. Findings likely secondary to differences in breast positioning/soft tissue attenuation. Less likely area of small ischemia. Low risk findings overall.     Cath: 03/07/20   There is moderate left ventricular systolic dysfunction. The left ventricular ejection fraction is 35-45% by visual estimate. LV end diastolic pressure is normal. Mid RCA lesion is 85% stenosed. A drug-eluting stent was successfully placed using a STENT RESOLUTE ONYX 3.5X30. Post intervention, there is a 0% residual stenosis. Mid RCA to Dist RCA lesion is 35% stenosed. Previously placed Prox LAD to Mid LAD stent (unknown type) is widely patent. Mid Cx lesion is 100% stenosed.   1. Severe 2 vessel obstructive CAD    - widely patent LAD stent    - 100% mid LCx occlusion. Very mild right to left collaterals.    - long 85% mid RCA 2. Moderate LV dysfunction. EF estimated at  35-40%. EDP is normal 3. Successful PCI of the mid RCA with OCT guidance and DES x 1.   Plan: would treat residual disease in LCx medically. DAPT for one year. Optimize medical therapy for LV dysfunction. Risk factor modification. Will observe overnight. Anticipate DC in am.    Assessment  and Plan   1. CAD   - no symptoms - 1 year since PCI, she can d/c plavix.  - EKG shows SR, no acute ischemic changes   2. Hyperlipidemia   -continue atorvastatin, we will request labs from pcp     3. HTN - bp is at goal, continue current meds     Arnoldo Lenis, M.D.

## 2021-04-25 ENCOUNTER — Other Ambulatory Visit: Payer: Self-pay | Admitting: Cardiology

## 2021-06-23 ENCOUNTER — Other Ambulatory Visit: Payer: Self-pay | Admitting: Cardiology

## 2021-07-07 ENCOUNTER — Other Ambulatory Visit: Payer: Self-pay | Admitting: Cardiology

## 2021-09-17 ENCOUNTER — Ambulatory Visit (INDEPENDENT_AMBULATORY_CARE_PROVIDER_SITE_OTHER): Payer: Medicaid Other | Admitting: Cardiology

## 2021-09-17 ENCOUNTER — Encounter: Payer: Self-pay | Admitting: Cardiology

## 2021-09-17 VITALS — BP 118/58 | HR 88 | Ht 70.0 in | Wt 128.8 lb

## 2021-09-17 DIAGNOSIS — E782 Mixed hyperlipidemia: Secondary | ICD-10-CM

## 2021-09-17 DIAGNOSIS — I10 Essential (primary) hypertension: Secondary | ICD-10-CM | POA: Diagnosis not present

## 2021-09-17 DIAGNOSIS — I2510 Atherosclerotic heart disease of native coronary artery without angina pectoris: Secondary | ICD-10-CM | POA: Diagnosis not present

## 2021-09-17 MED ORDER — ROSUVASTATIN CALCIUM 40 MG PO TABS: 40.0000 mg | ORAL_TABLET | Freq: Every day | ORAL | 3 refills | Status: DC

## 2021-09-17 NOTE — Patient Instructions (Signed)
Medication Instructions:  Your physician has recommended you make the following change in your medication:  -Stop Lipitor -Start Crestor 40 mg tablets daily   Labwork: None  Testing/Procedures: None  Follow-Up: Follow up with Dr. Harl Bowie in 6 months.   Any Other Special Instructions Will Be Listed Below (If Applicable).     If you need a refill on your cardiac medications before your next appointment, please call your pharmacy.

## 2021-09-17 NOTE — Progress Notes (Signed)
Clinical Summary Ms. Ivens is a 76 y.o.female seen today for follow up of the following medical problems.   1. CAD   - patient admitted to [facility] 10/12-10/14/14 with anterior wall STEMI, LVEF by LV gram 45% with anterior wall hypokinesis. LVEF 55% by echo with apical hypokinesis  - s/p DES to mid LAD   - 07/2014 nuclear stress without clear ischemia  - has had chronic nonspecific intermittent chest pains over the years   - recently admitted to [facility] - stress test during that admit suggested basal inferior infarct with peri-infarct ischemia         -admit 02/2020 with chest pain - 02/2020 cath as reported below, received DES to RCA. Residual disease treated medically.  - 02/2020 echo LVEF 50%, grade I dd   - no chest pains, no SOB/DOE - compliant with meds       2. HL   - Jan 2018 TC 167 TG 127 HDL 45 LDL 97 - labs followed by pcp, with recent labs earlier this month - she is compliant with statin   11/23/2019  TC 135, HDL 45, triglycerides 99, LDL 72. -she reports recent labs with pcp - compliant with atorvastatin   Jan 2023 TC 153 HDL 45 TG 85 LDL 90   3. DM2   - followed by pcp     4. HTN -remains compliant with meds      Past Medical History:  Diagnosis Date   Acute on chronic combined systolic and diastolic CHF (congestive heart failure) ([place]) 03/07/2020   CAD (coronary artery disease)    a. 10/2012 Ant STEMI/PCI: LM 20-30d, LAD 50/24m(2.5x24 Promus Premier DES), D1 50ost, LCX 50ost, OM1 50/50, RCa 553m, 50-70d @ bifurcation of PDA/PLA, EF 45%.   Diabetes mellitus without complication (HCC)    Hyperlipidemia    [place]   Tobacco abuse    Type II diabetes mellitus, uncontrolled 11/08/2012     Allergies  Allergen Reactions   Cheese Hives    On chin   Lasix [Furosemide] Hives   Aspirin Rash     Current Outpatient Medications  Medication Sig Dispense Refill   ACCU-CHEK AVIVA PLUS test strip      Accu-Chek  Softclix Lancets lancets      alum & mag hydroxide-simeth (MAALOX/MYLANTA) 200-200-20 MG/5ML suspension Take 15 mLs by mouth every 6 (six) hours as needed for indigestion or heartburn.     Apoaequorin (PREVAGEN EXTRA STRENGTH PO) Take 1 tablet by mouth daily.     Ascorbic Acid (VITAMIN C PO) Take 1 tablet by mouth daily.     aspirin EC 81 MG tablet Take 81 mg by mouth daily.     atorvastatin (LIPITOR) 80 MG tablet TAKE 1 TABLET AT 6 PM 90 tablet 3   B-COMPLEX-C PO Take by mouth.     Cholecalciferol (VITAMIN D3 PO) Take 1 tablet by mouth daily.     diphenhydrAMINE (BENADRYL) 25 MG tablet Take 50 mg by mouth daily as needed for allergies (congestion).      lisinopril (ZESTRIL) 5 MG tablet Take 1 tablet (5 mg total) by mouth in the morning and at bedtime. 180 tablet 3   MegaRed Omega-3 Krill Oil 500 MG CAPS Take 500 mg by mouth daily.     melatonin 5 MG TABS Take 5 mg by mouth.     metFORMIN (GLUCOPHAGE) 500 MG tablet Take 500 mg by mouth 2 (two) times daily with a meal.  metoprolol succinate (TOPROL-XL) 25 MG 24 hr tablet TAKE 1 TABLET (25 MG TOTAL) BY MOUTH DAILY. 30 tablet 11   metoprolol succinate (TOPROL-XL) 25 MG 24 hr tablet TAKE 1 TABLET (25 MG TOTAL) BY MOUTH DAILY. 90 tablet 1   metoprolol succinate (TOPROL-XL) 25 MG 24 hr tablet TAKE 1 TABLET DAILY 90 tablet 3   Multiple Vitamin (MULTIVITAMIN WITH MINERALS) TABS tablet Take 1 tablet by mouth daily.     nitroGLYCERIN (NITROSTAT) 0.4 MG SL tablet PLACE 1 TABLET UNDER THE   TONGUE AND ALLOW TO        DISSOLVE EVERY 5 MINUTES ASNEEDED FOR CHEST PAIN 25 tablet 3   pantoprazole (PROTONIX) 40 MG tablet Take 1 tablet (40 mg total) by mouth daily. 90 tablet 3   Potassium 99 MG TABS Take 99 mg by mouth daily.     Tetrahydroz-Dextran-PEG-Povid (VISINE ADVANCED RELIEF) 0.05-0.1-1-1 % SOLN Place 1 drop into both eyes 3 (three) times daily as needed (irritation).     No current facility-administered medications for this visit.     Past  Surgical History:  Procedure Laterality Date   APPENDECTOMY     CHOLECYSTECTOMY N/A 08/08/2014   Procedure: LAPAROSCOPIC CHOLECYSTECTOMY;  Surgeon: Aviva Signs, MD;  Location: [facility];  Service: General;  Laterality: N/A;   CORONARY STENT INTERVENTION N/A 03/07/2020   Procedure: CORONARY STENT INTERVENTION;  Surgeon: Martinique, Peter M, MD;  Location: [place] [facility];  Service: Cardiovascular;  Laterality: N/A;  RCA    CORONARY STENT PLACEMENT     INTRAVASCULAR ULTRASOUND/IVUS N/A 03/07/2020   Procedure: Intravascular Ultrasound/IVUS;  Surgeon: Martinique, Peter M, MD;  Location: [place] [facility];  Service: Cardiovascular;  Laterality: N/A;  OCT   LAPAROSCOPY WITH TUBAL LIGATION     LEFT HEART CATH AND CORONARY ANGIOGRAPHY N/A 03/07/2020   Procedure: LEFT HEART CATH AND CORONARY ANGIOGRAPHY;  Surgeon: Martinique, Peter M, MD;  Location: [place] [facility];  Service: Cardiovascular;  Laterality: N/A;   LEFT HEART CATHETERIZATION WITH CORONARY ANGIOGRAM N/A 11/06/2012   Procedure: LEFT HEART CATHETERIZATION WITH CORONARY ANGIOGRAM;  Surgeon: Blane Ohara, MD;  [facility] C[facility];  Service: Cardiovascular;  Laterality: N/A;   TUBAL LIGATION       Allergies  Allergen Reactions   Cheese Hives    On chin   Lasix [Furosemide] Hives   Aspirin Rash      Family History  Problem Relation Age of Onset   Diabetes Maternal Aunt      Social History Ms. Louks reports that she quit smoking about 8 years ago. Her smoking use included cigarettes. She has a 50.00 pack-year smoking history. She has never used smokeless tobacco. Ms. Bills reports current alcohol use.   Review of Systems CONSTITUTIONAL: No weight loss, fever, chills, weakness or fatigue.  HEENT: Eyes: No visual loss, blurred vision, double vision or yellow sclerae.No hearing loss, sneezing, congestion, runny nose or sore throat.  SKIN: No rash or itching.  CARDIOVASCULAR: per hpi RESPIRATORY: No shortness of breath, cough or  sputum.  GASTROINTESTINAL: No anorexia, nausea, vomiting or diarrhea. No abdominal pain or blood.  GENITOURINARY: No burning on urination, no polyuria NEUROLOGICAL: No headache, dizziness, syncope, paralysis, ataxia, numbness or tingling in the extremities. No change in bowel or bladder control.  MUSCULOSKELETAL: No muscle, back pain, joint pain or stiffness.  LYMPHATICS: No enlarged nodes. No history of splenectomy.  PSYCHIATRIC: No history of depression or anxiety.  ENDOCRINOLOGIC: No reports of sweating, cold or heat intolerance. No  polyuria or polydipsia.  Marland Kitchen   Physical Examination Today's Vitals   09/17/21 1041  BP: (!) 118/58  Pulse: 88  SpO2: 96%  Weight: 128 lb 12.8 oz (58.4 kg)  Height: '5\' 10"'$  (1.610 m)   Body mass index is 18.48 kg/m.  Gen: resting comfortably, no acute distress HEENT: no scleral icterus, pupils equal round and reactive, no palptable cervical adenopathy,  CV: RRR, no m/r/g, no jvd Resp: Clear to auscultation bilaterally GI: abdomen is soft, non-tender, non-distended, normal bowel sounds, no hepatosplenomegaly MSK: extremities are warm, no edema.  Skin: warm, no rash Neuro:  no focal deficits Psych: appropriate affect   Diagnostic Studies  11/06/12 Cath   Cardiac Catheterization and Percutaneous Coronary Intervention 10.12.2014   Hemodynamics:   AO 175/88   LV 177/16   Coronary angiography:   Coronary dominance: right   Left mainstem: Patent vessel with 20-30% distal LM stenosis   Left anterior descending (LAD): Proximal irregularity. There is severe disease in the mid-LAD. Diffuse 50% stenosis leading into a 99% mid-vessel stenosis. The first diagonal is large with 50% ostial stenosis and it originates at the proximal aspect of LAD plaque. The distal LAD is tortuous and widely patent as it wraps the LV apex.   **The mid-LAD was successfully stented using a 2.5x24 mm Promus Premier drug-eluting stent.**   Left circumflex (LCx): 50% ostial  stenosis, 50% stenosis of OM1 with sequential lesions   Right coronary artery (RCA): normal caliber, dominant vessel. There is diffuse disease noted. There is 50% mid-vessel stenosis and 50-70% bifurcational disease at the PDA/PLA origin.   Left ventriculography: Left ventricular systolic function is moderately depressed. There is hypokinesis of the distal anterior wall and anteroapex. The LVEF is estimated at 45%.   Final Conclusions: 1. Severe mid-LAD stenosis treated successfully with primary PCI using a DES   2. Moderate RCA and LCx stenosis   3. Moderate LV dysfunction 11/07/12 Echo: LVEF 55%, mild LVH, diastolic dysfunction without grade specified, hypokinesis apical anteroseptal and apical inferoseptal segments.   11/17/12 Clinic EKG   SR, normal axis, anteroseptal Q waves, diffuse T wave inversions       07/2014 Nuclear stress There was no ST segment deviation noted during stress. The study is normal. This is a low risk study. The left ventricular ejection fraction is mildly decreased (45-54%). There is a small mild intensity distal anterior wall defect at rest. Postinjection a similar but slightly more intense defect is noted. This wall segment has normal wall motion. Findings likely secondary to differences in breast positioning/soft tissue attenuation. Less likely area of small ischemia. Low risk findings overall.     Cath: 03/07/20   There is moderate left ventricular systolic dysfunction. The left ventricular ejection fraction is 35-45% by visual estimate. LV end diastolic pressure is normal. Mid RCA lesion is 85% stenosed. A drug-eluting stent was successfully placed using a STENT RESOLUTE ONYX 3.5X30. Post intervention, there is a 0% residual stenosis. Mid RCA to Dist RCA lesion is 35% stenosed. Previously placed Prox LAD to Mid LAD stent (unknown type) is widely patent. Mid Cx lesion is 100% stenosed.   1. Severe 2 vessel obstructive CAD    - widely patent LAD stent     - 100% mid LCx occlusion. Very mild right to left collaterals.    - long 85% mid RCA 2. Moderate LV dysfunction. EF estimated at 35-40%. EDP is normal 3. Successful PCI of the mid RCA with OCT guidance and DES x 1.  Plan: would treat residual disease in LCx medically. DAPT for one year. Optimize medical therapy for LV dysfunction. Risk factor modification. Will observe overnight. Anticipate DC in am.   Assessment and Plan  1. CAD   - no symptoms, continue current meds   2. Hyperlipidemia   -above goal, change atorvastatin to rosuvastatin '40mg'$  daily.      3. HTN - she is at goal, continue current meds  F/u 6 months      Arnoldo Lenis, M.D.

## 2021-10-22 ENCOUNTER — Other Ambulatory Visit: Payer: Self-pay | Admitting: Cardiology

## 2021-11-17 ENCOUNTER — Other Ambulatory Visit: Payer: Self-pay | Admitting: Cardiology

## 2022-01-03 ENCOUNTER — Other Ambulatory Visit: Payer: Self-pay | Admitting: Cardiology

## 2022-02-19 ENCOUNTER — Other Ambulatory Visit: Payer: Self-pay | Admitting: Cardiology

## 2022-04-03 ENCOUNTER — Ambulatory Visit: Payer: Medicare HMO | Admitting: Cardiology

## 2022-04-03 ENCOUNTER — Encounter: Payer: Self-pay | Admitting: Cardiology

## 2022-04-03 ENCOUNTER — Ambulatory Visit: Payer: Medicare HMO | Attending: Cardiology | Admitting: Cardiology

## 2022-04-03 ENCOUNTER — Other Ambulatory Visit: Payer: Self-pay | Admitting: Cardiology

## 2022-04-03 VITALS — BP 118/59 | HR 78 | Ht 69.0 in | Wt 131.4 lb

## 2022-04-03 DIAGNOSIS — I10 Essential (primary) hypertension: Secondary | ICD-10-CM | POA: Diagnosis not present

## 2022-04-03 DIAGNOSIS — E782 Mixed hyperlipidemia: Secondary | ICD-10-CM | POA: Diagnosis not present

## 2022-04-03 DIAGNOSIS — I2510 Atherosclerotic heart disease of native coronary artery without angina pectoris: Secondary | ICD-10-CM

## 2022-04-03 NOTE — Progress Notes (Signed)
Clinical Summary Marissa Schneider is a 77 y.o.female seen today for follow up of the following medical problems.   1. CAD   - patient admitted to [facility] 10/12-10/14/14 with anterior wall STEMI, LVEF by LV gram 45% with anterior wall hypokinesis. LVEF 55% by echo with apical hypokinesis  - s/p DES to mid LAD   - 07/2014 nuclear stress without clear ischemia  - has had chronic nonspecific intermittent chest pains over the years   - previously admitted to [facility] - stress test during that admit suggested basal inferior infarct with peri-infarct ischemia    -admit 02/2020 with chest pain - 02/2020 cath as reported below, received DES to RCA. Residual disease treated medically.  - 02/2020 echo LVEF 50%, grade I dd  - no chest pain, no SOB/DOE - compliant with meds      2. HL   Jan 2023 TC 153 HDL 45 TG 85 LDL 90 08/2021 TC 134 HDL 50 TG 91 LDL 67 - at last visit had changed atorva to crestor, LDL has come down   3. DM2   - followed by pcp     4. HTN -compliant with meds   Past Medical History:  Diagnosis Date   Acute on chronic combined systolic and diastolic CHF (congestive heart failure) ([place]) 03/07/2020   CAD (coronary artery disease)    a. 10/2012 Ant STEMI/PCI: LM 20-30d, LAD 50/12m(2.5x24 Promus Premier DES), D1 50ost, LCX 50ost, OM1 50/50, RCa 573m, 50-70d @ bifurcation of PDA/PLA, EF 45%.   Diabetes mellitus without complication (HCC)    Hyperlipidemia    [place]   Tobacco abuse    Type II diabetes mellitus, uncontrolled 11/08/2012     Allergies  Allergen Reactions   Cheese Hives    On chin   Lasix [Furosemide] Hives   Aspirin Rash     Current Outpatient Medications  Medication Sig Dispense Refill   ACCU-CHEK AVIVA PLUS test strip      Accu-Chek Softclix Lancets lancets      alum & mag hydroxide-simeth (MAALOX/MYLANTA) 200-200-20 MG/5ML suspension Take 15 mLs by mouth every 6 (six) hours as needed for indigestion or  heartburn.     Apoaequorin (PREVAGEN EXTRA STRENGTH PO) Take 1 tablet by mouth daily.     Ascorbic Acid (VITAMIN C PO) Take 1 tablet by mouth daily.     aspirin EC 81 MG tablet Take 81 mg by mouth daily.     B-COMPLEX-C PO Take by mouth.     Cholecalciferol (VITAMIN D3 PO) Take 1 tablet by mouth daily.     CRESTOR 40 MG tablet TAKE 1 TABLET DAILY. 90 tablet 3   diphenhydrAMINE (BENADRYL) 25 MG tablet Take 50 mg by mouth daily as needed for allergies (congestion).      ferrous sulfate 324 MG TBEC Take 1 tablet every day by oral route as directed for 90 days.     lisinopril (ZESTRIL) 5 MG tablet TAKE 1 TABLET IN THE       MORNING AND AT BEDTIME 180 tablet 3   MegaRed Omega-3 Krill Oil 500 MG CAPS Take 500 mg by mouth daily.     melatonin 5 MG TABS Take 5 mg by mouth.     metFORMIN (GLUCOPHAGE) 500 MG tablet Take 500 mg by mouth 2 (two) times daily with a meal.     metoprolol succinate (TOPROL-XL) 25 MG 24 hr tablet TAKE 1 TABLET (25 MG TOTAL) BY MOUTH DAILY.  90 tablet 1   Multiple Vitamin (MULTIVITAMIN WITH MINERALS) TABS tablet Take 1 tablet by mouth daily.     nitroGLYCERIN (NITROSTAT) 0.4 MG SL tablet PLACE 1 TABLET UNDER THE   TONGUE AND ALLOW TO        DISSOLVE EVERY 5 MINUTES ASNEEDED FOR CHEST PAIN 25 tablet 3   pantoprazole (PROTONIX) 40 MG tablet TAKE 1 TABLET DAILY 90 tablet 0   Potassium 99 MG TABS Take 99 mg by mouth daily.     Tetrahydroz-Dextran-PEG-Povid (VISINE ADVANCED RELIEF) 0.05-0.1-1-1 % SOLN Place 1 drop into both eyes 3 (three) times daily as needed (irritation).     No current facility-administered medications for this visit.     Past Surgical History:  Procedure Laterality Date   APPENDECTOMY     CHOLECYSTECTOMY N/A 08/08/2014   Procedure: LAPAROSCOPIC CHOLECYSTECTOMY;  Surgeon: Aviva Signs, MD;  Location: [facility];  Service: General;  Laterality: N/A;   CORONARY STENT INTERVENTION N/A 03/07/2020   Procedure: CORONARY STENT INTERVENTION;  Surgeon: Martinique, Peter M,  MD;  Location: [place] [facility];  Service: Cardiovascular;  Laterality: N/A;  RCA    CORONARY STENT PLACEMENT     INTRAVASCULAR ULTRASOUND/IVUS N/A 03/07/2020   Procedure: Intravascular Ultrasound/IVUS;  Surgeon: Martinique, Peter M, MD;  Location: [place] [facility];  Service: Cardiovascular;  Laterality: N/A;  OCT   LAPAROSCOPY WITH TUBAL LIGATION     LEFT HEART CATH AND CORONARY ANGIOGRAPHY N/A 03/07/2020   Procedure: LEFT HEART CATH AND CORONARY ANGIOGRAPHY;  Surgeon: Martinique, Peter M, MD;  Location: [place] [facility];  Service: Cardiovascular;  Laterality: N/A;   LEFT HEART CATHETERIZATION WITH CORONARY ANGIOGRAM N/A 11/06/2012   Procedure: LEFT HEART CATHETERIZATION WITH CORONARY ANGIOGRAM;  Surgeon: Blane Ohara, MD;  Location: [facility] CATH LAB;  Service: Cardiovascular;  Laterality: N/A;   TUBAL LIGATION       Allergies  Allergen Reactions   Cheese Hives    On chin   Lasix [Furosemide] Hives   Aspirin Rash      Family History  Problem Relation Age of Onset   Diabetes Maternal Aunt      Social History Marissa Schneider reports that she quit smoking about 9 years ago. Her smoking use included cigarettes. She has a 50.00 pack-year smoking history. She has never used smokeless tobacco. Marissa Schneider reports current alcohol use.   Review of Systems CONSTITUTIONAL: No weight loss, fever, chills, weakness or fatigue.  HEENT: Eyes: No visual loss, blurred vision, double vision or yellow sclerae.No hearing loss, sneezing, congestion, runny nose or sore throat.  SKIN: No rash or itching.  CARDIOVASCULAR: per hpi RESPIRATORY: No shortness of breath, cough or sputum.  GASTROINTESTINAL: No anorexia, nausea, vomiting or diarrhea. No abdominal pain or blood.  GENITOURINARY: No burning on urination, no polyuria NEUROLOGICAL: No headache, dizziness, syncope, paralysis, ataxia, numbness or tingling in the extremities. No change in bowel or bladder control.  MUSCULOSKELETAL: No muscle, back pain,  joint pain or stiffness.  LYMPHATICS: No enlarged nodes. No history of splenectomy.  PSYCHIATRIC: No history of depression or anxiety.  ENDOCRINOLOGIC: No reports of sweating, cold or heat intolerance. No polyuria or polydipsia.  Marland Kitchen   Physical Examination Today's Vitals   04/03/22 1014  BP: (!) 118/59  Pulse: 78  SpO2: 100%  Weight: 131 lb 6.4 oz (59.6 kg)  Height: '5\' 9"'$  (1.753 m)   Body mass index is 19.4 kg/m.  Gen: resting comfortably, no acute distress HEENT: no scleral icterus, pupils equal round  and reactive, no palptable cervical adenopathy,  CV: RRR, no m/rg, no jvd Resp: Clear to auscultation bilaterally GI: abdomen is soft, non-tender, non-distended, normal bowel sounds, no hepatosplenomegaly MSK: extremities are warm, no edema.  Skin: warm, no rash Neuro:  no focal deficits Psych: appropriate affect   Diagnostic Studies  11/06/12 Cath   Cardiac Catheterization and Percutaneous Coronary Intervention 10.12.2014   Hemodynamics:   AO 175/88   LV 177/16   Coronary angiography:   Coronary dominance: right   Left mainstem: Patent vessel with 20-30% distal LM stenosis   Left anterior descending (LAD): Proximal irregularity. There is severe disease in the mid-LAD. Diffuse 50% stenosis leading into a 99% mid-vessel stenosis. The first diagonal is large with 50% ostial stenosis and it originates at the proximal aspect of LAD plaque. The distal LAD is tortuous and widely patent as it wraps the LV apex.   **The mid-LAD was successfully stented using a 2.5x24 mm Promus Premier drug-eluting stent.**   Left circumflex (LCx): 50% ostial stenosis, 50% stenosis of OM1 with sequential lesions   Right coronary artery (RCA): normal caliber, dominant vessel. There is diffuse disease noted. There is 50% mid-vessel stenosis and 50-70% bifurcational disease at the PDA/PLA origin.   Left ventriculography: Left ventricular systolic function is moderately depressed. There is hypokinesis of  the distal anterior wall and anteroapex. The LVEF is estimated at 45%.   Final Conclusions: 1. Severe mid-LAD stenosis treated successfully with primary PCI using a DES   2. Moderate RCA and LCx stenosis   3. Moderate LV dysfunction 11/07/12 Echo: LVEF 55%, mild LVH, diastolic dysfunction without grade specified, hypokinesis apical anteroseptal and apical inferoseptal segments.   11/17/12 Clinic EKG   SR, normal axis, anteroseptal Q waves, diffuse T wave inversions       07/2014 Nuclear stress There was no ST segment deviation noted during stress. The study is normal. This is a low risk study. The left ventricular ejection fraction is mildly decreased (45-54%). There is a small mild intensity distal anterior wall defect at rest. Postinjection a similar but slightly more intense defect is noted. This wall segment has normal wall motion. Findings likely secondary to differences in breast positioning/soft tissue attenuation. Less likely area of small ischemia. Low risk findings overall.     Cath: 03/07/20   There is moderate left ventricular systolic dysfunction. The left ventricular ejection fraction is 35-45% by visual estimate. LV end diastolic pressure is normal. Mid RCA lesion is 85% stenosed. A drug-eluting stent was successfully placed using a STENT RESOLUTE ONYX 3.5X30. Post intervention, there is a 0% residual stenosis. Mid RCA to Dist RCA lesion is 35% stenosed. Previously placed Prox LAD to Mid LAD stent (unknown type) is widely patent. Mid Cx lesion is 100% stenosed.   1. Severe 2 vessel obstructive CAD    - widely patent LAD stent    - 100% mid LCx occlusion. Very mild right to left collaterals.    - long 85% mid RCA 2. Moderate LV dysfunction. EF estimated at 35-40%. EDP is normal 3. Successful PCI of the mid RCA with OCT guidance and DES x 1.   Plan: would treat residual disease in LCx medically. DAPT for one year. Optimize medical therapy for LV dysfunction. Risk  factor modification. Will observe overnight. Anticipate DC in am.     Assessment and Plan  1. CAD   - no recent symptoms, continue current meds   2. Hyperlipidemia   -at goal, continue current meds  3. HTN - at goal, continue current meds     Arnoldo Lenis, M.D.

## 2022-04-03 NOTE — Patient Instructions (Signed)
Medication Instructions:  Your physician recommends that you continue on your current medications as directed. Please refer to the Current Medication list given to you today.   Labwork: None  Testing/Procedures: None  Follow-Up: Follow up with Dr. Branch in 6 months.   Any Other Special Instructions Will Be Listed Below (If Applicable).     If you need a refill on your cardiac medications before your next appointment, please call your pharmacy.  

## 2022-05-28 ENCOUNTER — Other Ambulatory Visit: Payer: Self-pay | Admitting: Cardiology

## 2022-06-19 ENCOUNTER — Other Ambulatory Visit: Payer: Self-pay | Admitting: Cardiology

## 2022-07-02 ENCOUNTER — Other Ambulatory Visit: Payer: Self-pay | Admitting: Cardiology

## 2022-08-18 ENCOUNTER — Other Ambulatory Visit: Payer: Self-pay | Admitting: Cardiology

## 2022-10-10 ENCOUNTER — Other Ambulatory Visit: Payer: Self-pay | Admitting: Cardiology

## 2022-10-28 ENCOUNTER — Ambulatory Visit: Payer: Medicaid Other | Admitting: Cardiology

## 2022-12-01 NOTE — Progress Notes (Signed)
Cardiology Office Note:  .   Date:  12/15/2022  ID:  Marissa Schneider, DOB Jul 18, 1945, MRN [id number] PCP: Renaldo Harrison, DO  [facility] Providers Cardiologist:  Dina Rich, MD    History of Present Illness: .   Marissa Schneider is a 77 y.o. female with history of CAD, HTN, HLD, DM2.   Last saw Dr. Wyline Mood 03/2022 and doing well.  Patient comes in for f/u. Upset because she got a letter from PCP saying she needed testing done at [facility] but insurance wouldn't pay. Her LDL is  73 on rosuvastatin and PCP asked her to switch back to atorvastatin. He also recommended [place] but she hasn't started. Has depression and has lost 25 lbs over a couple years. CT of abdomen and pelvis ordered but denied by her insurance. Denies chest pain dyspnea, palpitations. Has a lot of gas relieved with protonix. Smokes when she's in the bathroom.     ROS:    Studies Reviewed: Marland Kitchen         Prior CV Studies:    11/06/12 Cath   Cardiac Catheterization and Percutaneous Coronary Intervention 10.12.2014   Hemodynamics:   AO 175/88   LV 177/16   Coronary angiography:   Coronary dominance: right   Left mainstem: Patent vessel with 20-30% distal LM stenosis   Left anterior descending (LAD): Proximal irregularity. There is severe disease in the mid-LAD. Diffuse 50% stenosis leading into a 99% mid-vessel stenosis. The first diagonal is large with 50% ostial stenosis and it originates at the proximal aspect of LAD plaque. The distal LAD is tortuous and widely patent as it wraps the LV apex.   **The mid-LAD was successfully stented using a 2.5x24 mm Promus Premier drug-eluting stent.**   Left circumflex (LCx): 50% ostial stenosis, 50% stenosis of OM1 with sequential lesions   Right coronary artery (RCA): normal caliber, dominant vessel. There is diffuse disease noted. There is 50% mid-vessel stenosis and 50-70% bifurcational disease at the PDA/PLA origin.   Left ventriculography: Left ventricular  systolic function is moderately depressed. There is hypokinesis of the distal anterior wall and anteroapex. The LVEF is estimated at 45%.   Final Conclusions: 1. Severe mid-LAD stenosis treated successfully with primary PCI using a DES   2. Moderate RCA and LCx stenosis   3. Moderate LV dysfunction 11/07/12 Echo: LVEF 55%, mild LVH, diastolic dysfunction without grade specified, hypokinesis apical anteroseptal and apical inferoseptal segments.   11/17/12 Clinic EKG   SR, normal axis, anteroseptal Q waves, diffuse T wave inversions       07/2014 Nuclear stress There was no ST segment deviation noted during stress. The study is normal. This is a low risk study. The left ventricular ejection fraction is mildly decreased (45-54%). There is a small mild intensity distal anterior wall defect at rest. Postinjection a similar but slightly more intense defect is noted. This wall segment has normal wall motion. Findings likely secondary to differences in breast positioning/soft tissue attenuation. Less likely area of small ischemia. Low risk findings overall.     Cath: 03/07/20   There is moderate left ventricular systolic dysfunction. The left ventricular ejection fraction is 35-45% by visual estimate. LV end diastolic pressure is normal. Mid RCA lesion is 85% stenosed. A drug-eluting stent was successfully placed using a STENT RESOLUTE ONYX 3.5X30. Post intervention, there is a 0% residual stenosis. Mid RCA to Dist RCA lesion is 35% stenosed. Previously placed Prox LAD to Mid LAD stent (unknown type) is widely patent. Mid  Cx lesion is 100% stenosed.   1. Severe 2 vessel obstructive CAD    - widely patent LAD stent    - 100% mid LCx occlusion. Very mild right to left collaterals.    - long 85% mid RCA 2. Moderate LV dysfunction. EF estimated at 35-40%. EDP is normal 3. Successful PCI of the mid RCA with OCT guidance and DES x 1.   Plan: would treat residual disease in LCx medically. DAPT  for one year. Optimize medical therapy for LV dysfunction. Risk factor modification. Will observe overnight. Anticipate DC in am.    Risk Assessment/Calculations:             Physical Exam:   VS:  BP (!) 116/54   Pulse 86   Ht 5\' 9"  (1.753 m)   Wt 125 lb (56.7 kg)   SpO2 98%   BMI 18.46 kg/m    Wt Readings from Last 3 Encounters:  12/15/22 125 lb (56.7 kg)  04/03/22 131 lb 6.4 oz (59.6 kg)  09/17/21 128 lb 12.8 oz (58.4 kg)    GEN: Thin, in no acute distress NECK: No JVD; No carotid bruits CARDIAC:  RRR, no murmurs, rubs, gallops RESPIRATORY:  Clear to auscultation without rales, wheezing or rhonchi  ABDOMEN: Soft, non-tender, non-distended EXTREMITIES:  No edema; No deformity   ASSESSMENT AND PLAN: .    CAD ant STEMI 2014 DES mLAD , DES RCA 2022 residual disease treated medically,  LVEF 50% on echo 2022 chronic nonspecific chest pain-no recent problems. -continue ASA, lisinopril, toprol and crestor.  HLD-LDL 73 08/2022 goal less than 55. Add zetia 10 mg once daily. Flp in 3 months  HTN-BP controlled  DM2-A1C 6.1 consider taking farxiga as Rx by PCP  Weight loss 25 lbs-CT ordered by PCP but insurance denied. F/u with PCP  Tobacco use only when in bathroom. Says she doesn't inhale.        Dispo: f/u Dr. Wyline Mood 6 months.  Signed, Jacolyn Reedy, PA-C

## 2022-12-15 ENCOUNTER — Ambulatory Visit: Payer: Medicaid Other | Attending: Cardiology | Admitting: Physician Assistant

## 2022-12-15 ENCOUNTER — Encounter: Payer: Self-pay | Admitting: Physician Assistant

## 2022-12-15 VITALS — BP 116/54 | HR 86 | Ht 69.0 in | Wt 125.0 lb

## 2022-12-15 DIAGNOSIS — I10 Essential (primary) hypertension: Secondary | ICD-10-CM

## 2022-12-15 DIAGNOSIS — I2510 Atherosclerotic heart disease of native coronary artery without angina pectoris: Secondary | ICD-10-CM | POA: Diagnosis not present

## 2022-12-15 DIAGNOSIS — E119 Type 2 diabetes mellitus without complications: Secondary | ICD-10-CM

## 2022-12-15 DIAGNOSIS — R634 Abnormal weight loss: Secondary | ICD-10-CM

## 2022-12-15 DIAGNOSIS — E782 Mixed hyperlipidemia: Secondary | ICD-10-CM | POA: Diagnosis not present

## 2022-12-15 MED ORDER — EZETIMIBE 10 MG PO TABS: 10.0000 mg | ORAL_TABLET | Freq: Every day | ORAL | 3 refills | Status: AC

## 2022-12-15 NOTE — Patient Instructions (Signed)
Medication Instructions:  Start Zetia 10 mg Daily   *If you need a refill on your cardiac medications before your next appointment, please call your pharmacy*   Lab Work: Your physician recommends that you return for lab work in: 3 Months ( Fasting Lipid)   If you have labs (blood work) drawn today and your tests are completely normal, you will receive your results only by: MyChart Message (if you have MyChart) OR A paper copy in the mail If you have any lab test that is abnormal or we need to change your treatment, we will call you to review the results.   Testing/Procedures: NONE    Follow-Up: At [facility], you and your health needs are our priority.  As part of our continuing mission to provide you with exceptional heart care, we have created designated Provider Care Teams.  These Care Teams include your primary Cardiologist (physician) and Advanced Practice Providers (APPs -  Physician Assistants and Nurse Practitioners) who all work together to provide you with the care you need, when you need it.  We recommend signing up for the patient portal called "MyChart".  Sign up information is provided on this After Visit Summary.  MyChart is used to connect with patients for Virtual Visits (Telemedicine).  Patients are able to view lab/test results, encounter notes, upcoming appointments, etc.  Non-urgent messages can be sent to your provider as well.   To learn more about what you can do with MyChart, go to [email or web address].    Your next appointment:   6 month(s)  Provider:   You may see Dina Rich, MD or one of the following Advanced Practice Providers on your designated Care Team:   Randall An, PA-C  Jacolyn Reedy, [place]     Other Instructions Thank you for choosing [facility]!

## 2022-12-29 ENCOUNTER — Other Ambulatory Visit: Payer: Self-pay | Admitting: Cardiology

## 2023-02-07 ENCOUNTER — Other Ambulatory Visit: Payer: Self-pay | Admitting: Cardiology

## 2023-06-07 ENCOUNTER — Other Ambulatory Visit: Payer: Self-pay

## 2023-06-07 MED ORDER — NITROGLYCERIN 0.4 MG SL SUBL: 0.4000 mg | SUBLINGUAL_TABLET | SUBLINGUAL | 3 refills | Status: DC | PRN

## 2023-07-29 ENCOUNTER — Other Ambulatory Visit: Payer: Self-pay | Admitting: Cardiology

## 2023-09-18 ENCOUNTER — Other Ambulatory Visit: Payer: Self-pay | Admitting: Cardiology

## 2023-09-28 ENCOUNTER — Other Ambulatory Visit: Payer: Self-pay | Admitting: Cardiology

## 2023-12-11 ENCOUNTER — Other Ambulatory Visit: Payer: Self-pay | Admitting: Cardiology

## 2024-01-26 ENCOUNTER — Other Ambulatory Visit: Payer: Self-pay | Admitting: Cardiology

## 2024-02-07 ENCOUNTER — Telehealth: Payer: Self-pay | Admitting: Cardiology

## 2024-02-07 NOTE — Telephone Encounter (Signed)
 Patient is asking for advise with Hives, she is experiencing body rashes

## 2024-02-14 ENCOUNTER — Other Ambulatory Visit: Payer: Self-pay

## 2024-02-21 MED ORDER — LISINOPRIL 5 MG PO TABS: 5.0000 mg | ORAL_TABLET | Freq: Two times a day (BID) | ORAL | 0 refills | Status: AC

## 2024-02-21 NOTE — Telephone Encounter (Signed)
 No Labs since 2024. Next O/V in April 2026  In accordance with refill protocols, please review and address the following requirements before this medication refill can be authorized:  Labs

## 2024-05-16 ENCOUNTER — Ambulatory Visit: Admitting: Cardiology
